# Patient Record
Sex: Female | Born: 1980 | Race: White | Hispanic: No | Marital: Single | Smoking: Former smoker
Health system: Southern US, Community
[De-identification: ages and names within clinical notes are randomized; demographics above are authoritative.]

## PROBLEM LIST (undated history)

## (undated) HISTORY — PX: CHOLECYSTECTOMY: SHX55

---
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
## 2004-04-03 ENCOUNTER — Emergency Department: Payer: Self-pay | Admitting: General Practice

## 2004-04-19 ENCOUNTER — Emergency Department: Payer: Self-pay | Admitting: Emergency Medicine

## 2005-04-19 ENCOUNTER — Emergency Department: Payer: Self-pay | Admitting: Emergency Medicine

## 2005-04-23 ENCOUNTER — Ambulatory Visit: Payer: Self-pay | Admitting: Emergency Medicine

## 2005-05-31 ENCOUNTER — Ambulatory Visit: Payer: Self-pay | Admitting: Family Medicine

## 2005-06-28 ENCOUNTER — Emergency Department: Payer: Self-pay | Admitting: Emergency Medicine

## 2005-09-29 ENCOUNTER — Observation Stay: Payer: Self-pay

## 2005-10-14 ENCOUNTER — Observation Stay: Payer: Self-pay | Admitting: Obstetrics & Gynecology

## 2005-11-06 ENCOUNTER — Observation Stay: Payer: Self-pay | Admitting: Obstetrics & Gynecology

## 2005-11-09 ENCOUNTER — Observation Stay: Payer: Self-pay

## 2005-11-24 ENCOUNTER — Observation Stay: Payer: Self-pay

## 2005-12-03 ENCOUNTER — Observation Stay: Payer: Self-pay | Admitting: Obstetrics and Gynecology

## 2005-12-07 ENCOUNTER — Observation Stay: Payer: Self-pay | Admitting: Obstetrics and Gynecology

## 2005-12-10 ENCOUNTER — Inpatient Hospital Stay: Payer: Self-pay | Admitting: Obstetrics & Gynecology

## 2006-04-07 ENCOUNTER — Other Ambulatory Visit: Payer: Self-pay

## 2006-04-07 ENCOUNTER — Inpatient Hospital Stay: Payer: Self-pay | Admitting: Vascular Surgery

## 2006-11-24 ENCOUNTER — Ambulatory Visit: Payer: Self-pay | Admitting: Gynecologic Oncology

## 2006-12-02 ENCOUNTER — Ambulatory Visit: Payer: Self-pay | Admitting: Gynecologic Oncology

## 2006-12-24 ENCOUNTER — Ambulatory Visit: Payer: Self-pay | Admitting: Gynecologic Oncology

## 2009-05-28 ENCOUNTER — Emergency Department: Payer: Self-pay | Admitting: Unknown Physician Specialty

## 2013-12-26 ENCOUNTER — Emergency Department: Payer: Self-pay | Admitting: Internal Medicine

## 2015-11-27 ENCOUNTER — Emergency Department: Payer: BLUE CROSS/BLUE SHIELD

## 2015-11-27 ENCOUNTER — Emergency Department
Admission: EM | Admit: 2015-11-27 | Discharge: 2015-11-27 | Disposition: A | Discharge status: Home / Self Care | Payer: BLUE CROSS/BLUE SHIELD | Attending: Emergency Medicine | Admitting: Emergency Medicine

## 2015-11-27 ENCOUNTER — Encounter: Payer: Self-pay | Admitting: Emergency Medicine

## 2015-11-27 DIAGNOSIS — R0789 Other chest pain: Secondary | ICD-10-CM | POA: Diagnosis not present

## 2015-11-27 DIAGNOSIS — F17200 Nicotine dependence, unspecified, uncomplicated: Secondary | ICD-10-CM | POA: Diagnosis not present

## 2015-11-27 DIAGNOSIS — R1013 Epigastric pain: Secondary | ICD-10-CM | POA: Insufficient documentation

## 2015-11-27 DIAGNOSIS — R079 Chest pain, unspecified: Secondary | ICD-10-CM

## 2015-11-27 LAB — CBC WITH DIFFERENTIAL/PLATELET
BASOS PCT: 1 %
Basophils Absolute: 0.2 10*3/uL — ABNORMAL HIGH (ref 0–0.1)
EOS ABS: 0.3 10*3/uL (ref 0–0.7)
Eosinophils Relative: 2 %
HEMATOCRIT: 42.2 % (ref 35.0–47.0)
HEMOGLOBIN: 14.3 g/dL (ref 12.0–16.0)
Lymphocytes Relative: 44 %
Lymphs Abs: 7.5 10*3/uL — ABNORMAL HIGH (ref 1.0–3.6)
MCH: 30.4 pg (ref 26.0–34.0)
MCHC: 33.8 g/dL (ref 32.0–36.0)
MCV: 90 fL (ref 80.0–100.0)
Monocytes Absolute: 0.7 10*3/uL (ref 0.2–0.9)
Monocytes Relative: 4 %
NEUTROS ABS: 8.4 10*3/uL — AB (ref 1.4–6.5)
NEUTROS PCT: 49 %
Platelets: 291 10*3/uL (ref 150–440)
RBC: 4.69 MIL/uL (ref 3.80–5.20)
RDW: 14.7 % — ABNORMAL HIGH (ref 11.5–14.5)
WBC: 17.1 10*3/uL — AB (ref 3.6–11.0)

## 2015-11-27 LAB — COMPREHENSIVE METABOLIC PANEL
ALBUMIN: 3.8 g/dL (ref 3.5–5.0)
ALK PHOS: 84 U/L (ref 38–126)
ALT: 42 U/L (ref 14–54)
ANION GAP: 7 (ref 5–15)
AST: 53 U/L — AB (ref 15–41)
CALCIUM: 9.6 mg/dL (ref 8.9–10.3)
CO2: 23 mmol/L (ref 22–32)
CREATININE: 0.47 mg/dL (ref 0.44–1.00)
Chloride: 108 mmol/L (ref 101–111)
GFR calc Af Amer: >60 mL/min (ref >60)
GFR calc non Af Amer: >60 mL/min (ref >60)
GLUCOSE: 139 mg/dL — AB (ref 65–99)
Potassium: 3.4 mmol/L — ABNORMAL LOW (ref 3.5–5.1)
SODIUM: 138 mmol/L (ref 135–145)
Total Bilirubin: 0.2 mg/dL — ABNORMAL LOW (ref 0.3–1.2)
Total Protein: 8.1 g/dL (ref 6.5–8.1)

## 2015-11-27 LAB — TROPONIN I: Troponin I: <0.03 ng/mL (ref <0.03)

## 2015-11-27 MED ORDER — RANITIDINE HCL 150 MG PO TABS: 150.0000 mg | ORAL_TABLET | Freq: Two times a day (BID) | ORAL | 1 refills | Status: DC

## 2015-11-27 MED ORDER — GI COCKTAIL ~~LOC~~
ORAL | Status: AC
  Administered 2015-11-27: 30 mL via ORAL
  Filled 2015-11-27: qty 30

## 2015-11-27 MED ORDER — GI COCKTAIL ~~LOC~~
30.0000 mL | Freq: Once | ORAL | Status: AC
  Administered 2015-11-27: 30 mL via ORAL

## 2015-11-27 MED ORDER — SUCRALFATE 1 G PO TABS: 1.0000 g | ORAL_TABLET | Freq: Four times a day (QID) | ORAL | 0 refills | Status: DC

## 2015-11-27 NOTE — ED Triage Notes (Signed)
C/o chest pain, worse with deep breathing.  Pt very anxious in triage.  Skin w/d with good color.

## 2015-11-27 NOTE — ED Provider Notes (Signed)
[facility] Emergency Department Provider Note    ____________________________________________   I have reviewed the triage vital signs and the nursing notes.   HISTORY  Chief Complaint Chest Pain   History limited by: Not Limited   HPI Emily Trevino is a 35 y.o. female  who presents to the emergency department today because of concerns for lower central chest pain. She states it is located in the lower central chest as well as in the right upper abdomen. It started this morning. It has been somewhat constant since it started. She describes it as being sharp. She did not have any appetite after it started. No associated shortness of breath or cough. No associated nausea. She has not noticed any change to her defecation recently.   History reviewed. No pertinent past medical history.  There are no active problems to display for this patient.   History reviewed. No pertinent surgical history.  Prior to Admission medications   Not on File    Allergies Review of patient's allergies indicates no known allergies.  No family history on file.  Social History Social History  Substance Use Topics  . Smoking status: Current Every Day Smoker  . Smokeless tobacco: Never Used  . Alcohol use Not on file    Review of Systems  Constitutional: Negative for fever. Cardiovascular: Positive for chest pain. Respiratory: Negative for shortness of breath. Gastrointestinal: Positive for abdominal pain. Neurological: Negative for headaches, focal weakness or numbness.  10-point ROS otherwise negative.  ____________________________________________   PHYSICAL EXAM:  VITAL SIGNS: ED Triage Vitals [11/27/15 1411]  Enc Vitals Group     BP 135/72     Pulse Rate 91     Resp (!) 22     Temp 98.3 F (36.8 C)     Temp Source Oral     SpO2 98 %     Weight 182 lb (82.6 kg)     Height 5\' 2"  (1.575 m)     Head Circumference      Peak Flow      Pain  Score 10   Constitutional: Alert and oriented. Well appearing and in no distress. Eyes: Conjunctivae are normal. Normal extraocular movements. ENT   Head: Normocephalic and atraumatic.   Nose: No congestion/rhinnorhea.   Mouth/Throat: Mucous membranes are moist.   Neck: No stridor. Hematological/Lymphatic/Immunilogical: No cervical lymphadenopathy. Cardiovascular: Normal rate, regular rhythm.  No murmurs, rubs, or gallops. Respiratory: Normal respiratory effort without tachypnea nor retractions. Breath sounds are clear and equal bilaterally. No wheezes/rales/rhonchi. Gastrointestinal: Soft and tender to palpation in the epigastric region. Genitourinary: Deferred Musculoskeletal: Normal range of motion in all extremities. No lower extremity edema. Neurologic:  Normal speech and language. No gross focal neurologic deficits are appreciated.  Skin:  Skin is warm, dry and intact. No rash noted. Psychiatric: Mood and affect are normal. Speech and behavior are normal. Patient exhibits appropriate insight and judgment.  ____________________________________________    LABS (pertinent positives/negatives)  Labs Reviewed  CBC WITH DIFFERENTIAL/PLATELET - Abnormal; Notable for the following:       Result Value   WBC 17.1 (*)    RDW 14.7 (*)    Neutro Abs 8.4 (*)    Lymphs Abs 7.5 (*)    Basophils Absolute 0.2 (*)    All other components within normal limits  COMPREHENSIVE METABOLIC PANEL - Abnormal; Notable for the following:    Potassium 3.4 (*)    Glucose, Bld 139 (*)    BUN <5 (*)  AST 53 (*)    Total Bilirubin 0.2 (*)    All other components within normal limits  TROPONIN I     ____________________________________________   EKG  I, Phineas SemenGraydon Sheddrick Lattanzio, attending physician, personally viewed and interpreted this EKG  EKG Time: 1407 Rate: 88 Rhythm: normal sinus rhythm Axis: left axis deviation Intervals: qtc 440 QRS: narrow ST changes: no st  elevation Impression: abnormal ekg   ____________________________________________    RADIOLOGY  CXR IMPRESSION:  Normal chest radiographs.      ____________________________________________   PROCEDURES  Procedures  ____________________________________________   INITIAL IMPRESSION / ASSESSMENT AND PLAN / ED COURSE  Pertinent labs & imaging results that were available during my care of the patient were reviewed by me and considered in my medical decision making (see chart for details).  Patient presented with chest pain today. Workup without any concerning findings a for mild leukocytosis of unclear significance. Patient did feel somewhat better after a GI cocktail. I did discuss importance of establishing care with primary care and I did request that she repeat blood work in a few weeks. Will discharge home with antacid and sucralfate.  ____________________________________________   FINAL CLINICAL IMPRESSION(S) / ED DIAGNOSES  Final diagnoses:  Chest pain, unspecified chest pain type     Note: This dictation was prepared with Dragon dictation. Any transcriptional errors that result from this process are unintentional    Phineas SemenGraydon Lillian Ballester, MD 11/27/15 1749

## 2015-11-27 NOTE — ED Notes (Signed)
Pt reports chest pain since awakening this am

## 2015-11-27 NOTE — Discharge Instructions (Signed)
Please seek medical attention for any high fevers, chest pain, shortness of breath, change in behavior, persistent vomiting, bloody stool or any other new or concerning symptoms.  

## 2019-03-11 ENCOUNTER — Other Ambulatory Visit: Payer: Self-pay | Admitting: Internal Medicine

## 2019-03-11 DIAGNOSIS — N6320 Unspecified lump in the left breast, unspecified quadrant: Secondary | ICD-10-CM

## 2019-03-12 ENCOUNTER — Other Ambulatory Visit: Payer: Self-pay | Admitting: Internal Medicine

## 2019-03-12 DIAGNOSIS — N6320 Unspecified lump in the left breast, unspecified quadrant: Secondary | ICD-10-CM

## 2019-03-24 ENCOUNTER — Ambulatory Visit
Admission: RE | Admit: 2019-03-24 | Discharge: 2019-03-24 | Disposition: A | Discharge status: Home / Self Care | Payer: BC Managed Care – PPO | Source: Ambulatory Visit | Attending: Internal Medicine | Admitting: Internal Medicine

## 2019-03-24 ENCOUNTER — Other Ambulatory Visit: Payer: Self-pay | Admitting: Internal Medicine

## 2019-03-24 ENCOUNTER — Other Ambulatory Visit: Payer: Self-pay

## 2019-03-24 DIAGNOSIS — N6320 Unspecified lump in the left breast, unspecified quadrant: Secondary | ICD-10-CM | POA: Insufficient documentation

## 2019-03-25 ENCOUNTER — Other Ambulatory Visit: Payer: Self-pay | Admitting: Internal Medicine

## 2019-04-05 ENCOUNTER — Other Ambulatory Visit: Payer: Self-pay | Admitting: Internal Medicine

## 2019-04-05 DIAGNOSIS — N6310 Unspecified lump in the right breast, unspecified quadrant: Secondary | ICD-10-CM

## 2019-07-11 ENCOUNTER — Encounter: Payer: Self-pay | Admitting: Internal Medicine

## 2019-07-28 ENCOUNTER — Emergency Department
Admission: EM | Admit: 2019-07-28 | Discharge: 2019-07-28 | Disposition: A | Discharge status: Home / Self Care | Payer: BC Managed Care – PPO | Attending: Emergency Medicine | Admitting: Emergency Medicine

## 2019-07-28 ENCOUNTER — Other Ambulatory Visit: Payer: Self-pay

## 2019-07-28 ENCOUNTER — Encounter: Payer: Self-pay | Admitting: Emergency Medicine

## 2019-07-28 ENCOUNTER — Emergency Department: Payer: BC Managed Care – PPO

## 2019-07-28 DIAGNOSIS — R109 Unspecified abdominal pain: Secondary | ICD-10-CM | POA: Diagnosis not present

## 2019-07-28 DIAGNOSIS — R1031 Right lower quadrant pain: Secondary | ICD-10-CM | POA: Diagnosis not present

## 2019-07-28 DIAGNOSIS — N390 Urinary tract infection, site not specified: Secondary | ICD-10-CM | POA: Diagnosis not present

## 2019-07-28 DIAGNOSIS — R319 Hematuria, unspecified: Secondary | ICD-10-CM | POA: Diagnosis not present

## 2019-07-28 DIAGNOSIS — F17200 Nicotine dependence, unspecified, uncomplicated: Secondary | ICD-10-CM | POA: Diagnosis not present

## 2019-07-28 DIAGNOSIS — Z9049 Acquired absence of other specified parts of digestive tract: Secondary | ICD-10-CM | POA: Diagnosis not present

## 2019-07-28 DIAGNOSIS — Z79899 Other long term (current) drug therapy: Secondary | ICD-10-CM | POA: Insufficient documentation

## 2019-07-28 LAB — URINALYSIS, COMPLETE (UACMP) WITH MICROSCOPIC
Bilirubin Urine: NEGATIVE
Glucose, UA: NEGATIVE mg/dL
Ketones, ur: NEGATIVE mg/dL
Nitrite: NEGATIVE
Protein, ur: NEGATIVE mg/dL
Specific Gravity, Urine: 1.004 — ABNORMAL LOW (ref 1.005–1.030)
pH: 8 (ref 5.0–8.0)

## 2019-07-28 LAB — CBC
HCT: 39.5 % (ref 36.0–46.0)
Hemoglobin: 13.2 g/dL (ref 12.0–15.0)
MCH: 29.6 pg (ref 26.0–34.0)
MCHC: 33.4 g/dL (ref 30.0–36.0)
MCV: 88.6 fL (ref 80.0–100.0)
Platelets: 322 10*3/uL (ref 150–400)
RBC: 4.46 MIL/uL (ref 3.87–5.11)
RDW: 13.4 % (ref 11.5–15.5)
WBC: 13.2 10*3/uL — ABNORMAL HIGH (ref 4.0–10.5)
nRBC: 0 % (ref 0.0–0.2)

## 2019-07-28 LAB — BASIC METABOLIC PANEL
Anion gap: 8 (ref 5–15)
BUN: 8 mg/dL (ref 6–20)
CO2: 21 mmol/L — ABNORMAL LOW (ref 22–32)
Calcium: 9 mg/dL (ref 8.9–10.3)
Chloride: 107 mmol/L (ref 98–111)
Creatinine, Ser: 0.58 mg/dL (ref 0.44–1.00)
GFR calc Af Amer: >60 mL/min (ref >60)
GFR calc non Af Amer: >60 mL/min (ref >60)
Glucose, Bld: 94 mg/dL (ref 70–99)
Potassium: 3.5 mmol/L (ref 3.5–5.1)
Sodium: 136 mmol/L (ref 135–145)

## 2019-07-28 LAB — POCT PREGNANCY, URINE: Preg Test, Ur: NEGATIVE

## 2019-07-28 MED ORDER — SODIUM CHLORIDE 0.9 % IV BOLUS
1000.0000 mL | Freq: Once | INTRAVENOUS | Status: AC
  Administered 2019-07-28: 1000 mL via INTRAVENOUS

## 2019-07-28 MED ORDER — ONDANSETRON HCL 4 MG/2ML IJ SOLN
4.0000 mg | Freq: Once | INTRAMUSCULAR | Status: AC
  Administered 2019-07-28: 4 mg via INTRAVENOUS
  Filled 2019-07-28: qty 2

## 2019-07-28 MED ORDER — KETOROLAC TROMETHAMINE 10 MG PO TABS: 10.0000 mg | ORAL_TABLET | Freq: Four times a day (QID) | ORAL | 0 refills | Status: DC | PRN

## 2019-07-28 MED ORDER — OXYCODONE-ACETAMINOPHEN 5-325 MG PO TABS
1.0000 | ORAL_TABLET | Freq: Once | ORAL | Status: AC
  Administered 2019-07-28: 1 via ORAL
  Filled 2019-07-28: qty 1

## 2019-07-28 MED ORDER — KETOROLAC TROMETHAMINE 30 MG/ML IJ SOLN
30.0000 mg | Freq: Once | INTRAMUSCULAR | Status: AC
  Administered 2019-07-28: 30 mg via INTRAVENOUS

## 2019-07-28 MED ORDER — KETOROLAC TROMETHAMINE 30 MG/ML IJ SOLN
INTRAMUSCULAR | Status: AC
  Filled 2019-07-28: qty 1

## 2019-07-28 MED ORDER — SODIUM CHLORIDE 0.9 % IV SOLN
1.0000 g | Freq: Once | INTRAVENOUS | Status: AC
  Administered 2019-07-28: 1 g via INTRAVENOUS
  Filled 2019-07-28: qty 10

## 2019-07-28 MED ORDER — OXYCODONE-ACETAMINOPHEN 5-325 MG PO TABS: 1.0000 | ORAL_TABLET | ORAL | 0 refills | Status: DC | PRN

## 2019-07-28 MED ORDER — CEFDINIR 300 MG PO CAPS: 300.0000 mg | ORAL_CAPSULE | Freq: Two times a day (BID) | ORAL | 0 refills | Status: DC

## 2019-07-28 MED ORDER — MORPHINE SULFATE (PF) 4 MG/ML IV SOLN
4.0000 mg | Freq: Once | INTRAVENOUS | Status: AC
  Administered 2019-07-28: 4 mg via INTRAVENOUS
  Filled 2019-07-28: qty 1

## 2019-07-28 NOTE — ED Triage Notes (Signed)
Pt presents to ED c/o R flank pain wrapping around to RLQ x1 wk.

## 2019-07-28 NOTE — ED Notes (Signed)
A rolled towel placed behind pt back and lights out for comfort. Pt resting quietly.

## 2019-07-28 NOTE — ED Provider Notes (Signed)
[facility] Emergency Department Provider Note  ____________________________________________   First MD Initiated Contact with Patient 07/28/19 1700     (approximate)  I have reviewed the triage vital signs and the nursing notes.   HISTORY  Chief Complaint Flank Pain    HPI Emily Trevino Emily Trevino is a 39 y.o. female [facility] emergency department complaining of right-sided flank pain that radiates to the lower abdomen.  Patient states symptoms have been ongoing since she started her period last week.  States the pain has gotten so bad she cannot hardly stand.  Decreased appetite.  No fever or chills.  No vomiting or diarrhea.    History reviewed. No pertinent past medical history.  There are no problems to display for this patient.   Past Surgical History:  Procedure Laterality Date  . CHOLECYSTECTOMY      Prior to Admission medications   Medication Sig Start Date End Date Taking? Authorizing Provider  cefdinir (OMNICEF) 300 MG capsule Take 1 capsule (300 mg total) by mouth 2 (two) times daily. 07/28/19   Heike Pounds, Linden Dolin, PA-C  escitalopram (LEXAPRO) 20 MG tablet Take 20 mg by mouth daily. 03/05/19   [provider]  ketorolac (TORADOL) 10 MG tablet Take 1 tablet (10 mg total) by mouth every 6 (six) hours as needed. 07/28/19   Kristine Chahal, Linden Dolin, PA-C  levothyroxine (SYNTHROID) 50 MCG tablet Take 50 mcg by mouth daily. 04/05/19   [provider]  oxyCODONE-acetaminophen (PERCOCET) 5-325 MG tablet Take 1 tablet by mouth every 4 (four) hours as needed for severe pain. 07/28/19 07/27/20  Caryn Section Linden Dolin, PA-C  ranitidine (ZANTAC) 150 MG tablet Take 1 tablet (150 mg total) by mouth 2 (two) times daily. 11/27/15 11/26/16  Nance Pear, MD  sucralfate (CARAFATE) 1 g tablet Take 1 tablet (1 g total) by mouth 4 (four) times daily. 11/27/15   Nance Pear, MD  traZODone (DESYREL) 50 MG tablet Take 50 mg by mouth at bedtime. 07/06/19   [provider]     Allergies Patient has no known allergies.  Family History  Adopted: Yes    Social History Social History   Tobacco Use  . Smoking status: Current Every Day Smoker  . Smokeless tobacco: Never Used  Substance Use Topics  . Alcohol use: Not on file  . Drug use: Not on file    Review of Systems  Constitutional: No fever/chills Eyes: No visual changes. ENT: No sore throat. Respiratory: Denies cough Cardiovascular: Denies chest pain Gastrointestinal: Positive abdominal pain Genitourinary: Negative for dysuria. Musculoskeletal: Negative for back pain. Skin: Negative for rash. Psychiatric: no mood changes,     ____________________________________________   PHYSICAL EXAM:  VITAL SIGNS: ED Triage Vitals  Enc Vitals Group     BP 07/28/19 1603 114/74     Pulse Rate 07/28/19 1603 89     Resp 07/28/19 1603 18     Temp 07/28/19 1603 98.4 F (36.9 C)     Temp Source 07/28/19 1603 Oral     SpO2 07/28/19 1603 99 %     Weight 07/28/19 1603 192 lb (87.1 kg)     Height 07/28/19 1603 5\' 2"  (1.575 m)     Head Circumference --      Peak Flow --      Pain Score 07/28/19 1608 9     Pain Loc --      Pain Edu? --      Excl. in [place]? --     Constitutional:  Alert and oriented. Well appearing and in no acute distress.  Patient is crying due to discomfort Eyes: Conjunctivae are normal.  Head: Atraumatic. Nose: No congestion/rhinnorhea. Mouth/Throat: Mucous membranes are moist.   Neck:  supple no lymphadenopathy noted Cardiovascular: Normal rate, regular rhythm. Heart sounds are normal Respiratory: Normal respiratory effort.  No retractions, lungs c t a  Abd: soft tender in the right upper and lower quadrants, bs normal all 4 quad GU: deferred Musculoskeletal: FROM all extremities, warm and well perfused Neurologic:  Normal speech and language.  Skin:  Skin is warm, dry and intact. No rash noted. Psychiatric: Mood and affect are normal. Speech and behavior are  normal.  ____________________________________________   LABS (all labs ordered are listed, but only abnormal results are displayed)  Labs Reviewed  URINALYSIS, COMPLETE (UACMP) WITH MICROSCOPIC - Abnormal; Notable for the following components:      Result Value   Color, Urine STRAW (*)    APPearance HAZY (*)    Specific Gravity, Urine 1.004 (*)    Hgb urine dipstick LARGE (*)    Leukocytes,Ua SMALL (*)    Bacteria, UA RARE (*)    All other components within normal limits  BASIC METABOLIC PANEL - Abnormal; Notable for the following components:   CO2 21 (*)    All other components within normal limits  CBC - Abnormal; Notable for the following components:   WBC 13.2 (*)    All other components within normal limits  POC URINE PREG, ED  POCT PREGNANCY, URINE   ____________________________________________   ____________________________________________  RADIOLOGY  CT renal stone study does not show a current stone, has some edema in the collecting area which could be infection versus recently passed stone.  ____________________________________________   PROCEDURES  Procedure(s) performed: Rocephin 1 g IV, morphine 4 mg IV, Zofran 4 mg IV, Toradol 30 mg IV, normal saline 1 L IV   Procedures    ____________________________________________   INITIAL IMPRESSION / ASSESSMENT AND PLAN / ED COURSE  Pertinent labs & imaging results that were available during my care of the patient were reviewed by me and considered in my medical decision making (see chart for details).   Patient is a 39 year old female presents emergency department with concerns of right-sided abdominal pain.  See HPI  Physical exam shows patient to appear in a good amount of discomfort.  Vitals are stable.  Abdomen is tender along the right upper and lower quadrants.  DDx: Kidney stone, acute appendicitis, ovarian cyst  CBC with elevated WBC of 13.2, basic metabolic panel is normal, POC pregnancy is  negative, urinalysis is hazy with large amount of hemoglobin, small leuks and rare bacteria  CT renal stone study did show some edema at the collecting area but no obvious stone.  Due to the elevated WBC along with the leuks in her urine I will treat her for infection.  She is to follow-up with her regular doctor if not improving in 2 to 3 days.  Return emergency department worsening.  She was given Rocephin 1 g IV, Toradol 30 mg IV, and morphine 4 mg IV.  She was given Percocet at discharge.  She was also given a prescription for Omnicef, Percocet, and Toradol.  She was given strict instructions once again to return if worsening.  She states she understands.  She is discharged stable condition.  Explained the lab results to the patient.  Explained her we do need to do a renal stone study due to the flank pain  and the hematuria.  This may also be able to rule out appendicitis.  She was given an IV, morphine 4 mg IV, Zofran 4 mg IV, and 1 L normal saline.   Emily Trevino was evaluated in Emergency Department on 07/28/2019 for the symptoms described in the history of present illness. She was evaluated in the context of the global COVID-19 pandemic, which necessitated consideration that the patient might be at risk for infection with the SARS-CoV-2 virus that causes COVID-19. Institutional protocols and algorithms that pertain to the evaluation of patients at risk for COVID-19 are in a state of rapid change based on information released by regulatory bodies including the CDC and federal and state organizations. These policies and algorithms were followed during the patient's care in the ED.   As part of my medical decision making, I reviewed the following data within the electronic MEDICAL RECORD NUMBER Nursing notes reviewed and incorporated, Labs reviewed , Old chart reviewed, Radiograph reviewed , Notes from prior ED visits and [place] Controlled Substance  Database  ____________________________________________   FINAL CLINICAL IMPRESSION(S) / ED DIAGNOSES  Final diagnoses:  Right flank pain  Urinary tract infection with hematuria, site unspecified      NEW MEDICATIONS STARTED DURING THIS VISIT:  New Prescriptions   CEFDINIR (OMNICEF) 300 MG CAPSULE    Take 1 capsule (300 mg total) by mouth 2 (two) times daily.   KETOROLAC (TORADOL) 10 MG TABLET    Take 1 tablet (10 mg total) by mouth every 6 (six) hours as needed.   OXYCODONE-ACETAMINOPHEN (PERCOCET) 5-325 MG TABLET    Take 1 tablet by mouth every 4 (four) hours as needed for severe pain.     Note:  This document was prepared using Dragon voice recognition software and may include unintentional dictation errors.    Faythe Ghee, PA-C 07/28/19 1820    Concha Se, MD 07/28/19 2023

## 2019-07-28 NOTE — Discharge Instructions (Signed)
Follow-up with your regular doctor if not improved in 3 days.  Return emergency department worsening.  CT did show swelling around the renal collecting area which could indicate infection or recently passed stone.  Being treated for an infection.  Take your antibiotic as prescribed.  Pain medication as needed.

## 2019-07-31 ENCOUNTER — Emergency Department: Payer: BC Managed Care – PPO

## 2019-07-31 ENCOUNTER — Other Ambulatory Visit: Payer: Self-pay

## 2019-07-31 DIAGNOSIS — R0602 Shortness of breath: Secondary | ICD-10-CM | POA: Insufficient documentation

## 2019-07-31 DIAGNOSIS — Z5321 Procedure and treatment not carried out due to patient leaving prior to being seen by health care provider: Secondary | ICD-10-CM | POA: Insufficient documentation

## 2019-07-31 LAB — CBC
HCT: 39.7 % (ref 36.0–46.0)
Hemoglobin: 13.4 g/dL (ref 12.0–15.0)
MCH: 29.5 pg (ref 26.0–34.0)
MCHC: 33.8 g/dL (ref 30.0–36.0)
MCV: 87.3 fL (ref 80.0–100.0)
Platelets: 298 10*3/uL (ref 150–400)
RBC: 4.55 MIL/uL (ref 3.87–5.11)
RDW: 13.2 % (ref 11.5–15.5)
WBC: 10.8 10*3/uL — ABNORMAL HIGH (ref 4.0–10.5)
nRBC: 0 % (ref 0.0–0.2)

## 2019-07-31 LAB — COMPREHENSIVE METABOLIC PANEL
ALT: 21 U/L (ref 0–44)
AST: 25 U/L (ref 15–41)
Albumin: 3.9 g/dL (ref 3.5–5.0)
Alkaline Phosphatase: 62 U/L (ref 38–126)
Anion gap: 10 (ref 5–15)
BUN: 8 mg/dL (ref 6–20)
CO2: 20 mmol/L — ABNORMAL LOW (ref 22–32)
Calcium: 9.1 mg/dL (ref 8.9–10.3)
Chloride: 107 mmol/L (ref 98–111)
Creatinine, Ser: 0.56 mg/dL (ref 0.44–1.00)
GFR calc Af Amer: >60 mL/min (ref >60)
GFR calc non Af Amer: >60 mL/min (ref >60)
Glucose, Bld: 192 mg/dL — ABNORMAL HIGH (ref 70–99)
Potassium: 3.7 mmol/L (ref 3.5–5.1)
Sodium: 137 mmol/L (ref 135–145)
Total Bilirubin: 0.4 mg/dL (ref 0.3–1.2)
Total Protein: 7.9 g/dL (ref 6.5–8.1)

## 2019-07-31 MED ORDER — OXYCODONE-ACETAMINOPHEN 5-325 MG PO TABS
1.0000 | ORAL_TABLET | ORAL | Status: DC | PRN
  Administered 2019-07-31: 1 via ORAL
  Filled 2019-07-31: qty 1

## 2019-07-31 NOTE — ED Notes (Signed)
Patient given update regarding wait time, verbalized understanding. 

## 2019-07-31 NOTE — ED Triage Notes (Signed)
Pt states was here this weeks and diagnosed with a kidney stone. Pt states she feel shob, has increased pain to back. Pt denies vomiting or known fever. Pt states has been taking her antibiotics.

## 2019-08-01 ENCOUNTER — Emergency Department
Admission: EM | Admit: 2019-08-01 | Discharge: 2019-08-01 | Disposition: A | Discharge status: Home / Self Care | Payer: BC Managed Care – PPO | Attending: Emergency Medicine | Admitting: Emergency Medicine

## 2019-08-01 NOTE — ED Notes (Signed)
Pt updated on wait. Pt verbalizes understanding, but states she may not be able to wait much longer due to having to work in am.

## 2019-08-01 NOTE — ED Notes (Signed)
Pt states she cannot wait any longer. Pt encouraged to stay and be seen, declines. Pt encouraged to return if problems occur, pt verbalizes understanding. Pt states her husband is waiting for her in truck.

## 2019-08-05 ENCOUNTER — Ambulatory Visit: Payer: BC Managed Care – PPO | Admitting: Internal Medicine

## 2019-08-06 ENCOUNTER — Encounter: Payer: Self-pay | Admitting: Internal Medicine

## 2019-08-06 ENCOUNTER — Other Ambulatory Visit: Payer: Self-pay

## 2019-08-06 ENCOUNTER — Ambulatory Visit (INDEPENDENT_AMBULATORY_CARE_PROVIDER_SITE_OTHER): Payer: BC Managed Care – PPO | Admitting: Internal Medicine

## 2019-08-06 VITALS — BP 129/82 | HR 91 | Wt 189.2 lb

## 2019-08-06 DIAGNOSIS — F17200 Nicotine dependence, unspecified, uncomplicated: Secondary | ICD-10-CM | POA: Diagnosis not present

## 2019-08-06 DIAGNOSIS — L03314 Cellulitis of groin: Secondary | ICD-10-CM

## 2019-08-06 DIAGNOSIS — N200 Calculus of kidney: Secondary | ICD-10-CM | POA: Diagnosis not present

## 2019-08-06 DIAGNOSIS — E669 Obesity, unspecified: Secondary | ICD-10-CM

## 2019-08-06 DIAGNOSIS — R7303 Prediabetes: Secondary | ICD-10-CM

## 2019-08-06 DIAGNOSIS — J301 Allergic rhinitis due to pollen: Secondary | ICD-10-CM | POA: Diagnosis not present

## 2019-08-06 NOTE — Assessment & Plan Note (Signed)
Started on Keflex

## 2019-08-06 NOTE — Assessment & Plan Note (Signed)
Controlled with diet 

## 2019-08-06 NOTE — Progress Notes (Addendum)
Established Patient Office Visit  Subjective:  Patient ID: Emily Trevino, female    DOB: December 03, 1980  Age: 39 y.o. MRN: [id number]  CC:  Chief Complaint  Patient presents with  . lab results    patient here for results of her thryoid panel     HPI  Emily Trevino presented for swelling of the left groin area patient has been developing some boil painful swelling of the left groin area with minimal discharge there is no history of any vaginal discharge lymphadenitis or injury to that area except for intermittent shaving the groin and legs she also stopped her thyroid medication she has been taking 50 mcg of levothyroxine a day has been started by a nurse practitioner but she complains of headache.  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 daily and after a month she can take it to 50 mcg p.o. daily.   If she did not tolerated none we will start her back on an different brand.  She has a history of kidney stone seen in the ER CT scan was done.   She is better and there is no hematuria no flank pain.   #3 he has a problem with obesity and she is trying to work on that.   She also complains of a small rash like pimples on the forehead I told her that we will try to put her on doxycycline and a small dose after she got better from the cellulitis of the left thigh she has a sleeping problem and take does not help for that  History reviewed. No pertinent past medical history.  Past Surgical History:  Procedure Laterality Date  . CHOLECYSTECTOMY      Family History  Adopted: Yes    Social History   Socioeconomic History  . Marital status: Single    Spouse name: Not on file  . Number of children: Not on file  . Years of education: Not on file  . Highest education level: Not on file  Occupational History  . Not on file  Tobacco Use  . Smoking status: Current Every Day  Smoker  . Smokeless tobacco: Never Used  Substance and Sexual Activity  . Alcohol use: Not on file  . Drug use: Not on file  . Sexual activity: Not on file  Other Topics Concern  . Not on file  Social History Narrative  . Not on file   Social Determinants of Health   Financial Resource Strain:   . Difficulty of Paying Living Expenses:   Food Insecurity:   . Worried About Programme researcher, broadcasting/film/video in the Last Year:   . Barista in the Last Year:   Transportation Needs:   . Freight forwarder (Medical):   Marland Kitchen Lack of Transportation (Non-Medical):   Physical Activity:   . Days of Exercise per Week:   . Minutes of Exercise per Session:   Stress:   . Feeling of Stress :   Social Connections:   . Frequency of Communication with Friends and Family:   . Frequency of Social Gatherings with Friends and Family:   . Attends Religious Services:   . Active Member of Clubs or Organizations:   . Attends Banker Meetings:   Marland Kitchen Marital Status:   Intimate Partner Violence:   . Fear of Current or Ex-Partner:   .  Emotionally Abused:   Marland Kitchen Physically Abused:   . Sexually Abused:      Current Outpatient Medications:  .  cefdinir (OMNICEF) 300 MG capsule, Take 1 capsule (300 mg total) by mouth 2 (two) times daily., Disp: 20 capsule, Rfl: 0 .  ketorolac (TORADOL) 10 MG tablet, Take 1 tablet (10 mg total) by mouth every 6 (six) hours as needed., Disp: 20 tablet, Rfl: 0 .  levothyroxine (SYNTHROID) 50 MCG tablet, Take 50 mcg by mouth daily., Disp: , Rfl:  .  traZODone (DESYREL) 50 MG tablet, Take 50 mg by mouth at bedtime., Disp: , Rfl:  .  ranitidine (ZANTAC) 150 MG tablet, Take 1 tablet (150 mg total) by mouth 2 (two) times daily., Disp: 60 tablet, Rfl: 1   No Known Allergies  ROS Review of Systems  Constitutional: Negative.  Negative for chills and fever.  HENT: Negative.  Negative for hearing loss, nosebleeds, postnasal drip, rhinorrhea, sinus pressure and sore throat.     Eyes: Negative.   Respiratory: Negative.   Cardiovascular: Negative.   Gastrointestinal: Negative.   Endocrine: Negative for polydipsia.  Genitourinary: Positive for flank pain.  Skin: Positive for color change.       She has swelling of the left groin with oozing discharge she is tender without any lymph node enlargement  Neurological: Negative for dizziness.  Hematological: Negative for adenopathy.      Objective:    Physical Exam  Constitutional: She appears well-developed and well-nourished.  HENT:  Head: Normocephalic and atraumatic.  Mouth/Throat: Oropharynx is clear and moist.  Eyes: Pupils are equal, round, and reactive to light. No scleral icterus.  Neck: No JVD present. No tracheal deviation present.  Cardiovascular: Normal rate, regular rhythm, normal heart sounds and intact distal pulses.  Pulmonary/Chest: Effort normal and breath sounds normal. She has no wheezes. She has no rales.  Abdominal: There is no abdominal tenderness. There is no rebound.  Musculoskeletal:        General: No edema.     Cervical back: Neck supple.  Lymphadenopathy:    She has no cervical adenopathy.  Neurological: No cranial nerve deficit.  Skin: Rash (forehead) noted.  Psychiatric: Her behavior is normal.    BP 129/82   Pulse 91   Wt 189 lb 3.2 oz (85.8 kg)   LMP 07/22/2019 (Exact Date)   BMI 34.61 kg/m  Wt Readings from Last 3 Encounters:  08/06/19 189 lb 3.2 oz (85.8 kg)  07/31/19 192 lb (87.1 kg)  07/28/19 192 lb (87.1 kg)     Health Maintenance Due  Topic Date Due  . HIV Screening  Never done  . COVID-19 Vaccine (1) Never done  . TETANUS/TDAP  Never done  . PAP SMEAR-Modifier  Never done    There are no preventive care reminders to display for this patient.  No results found for: TSH Lab Results  Component Value Date   WBC 10.8 (H) 07/31/2019   HGB 13.4 07/31/2019   HCT 39.7 07/31/2019   MCV 87.3 07/31/2019   PLT 298 07/31/2019   Lab Results  Component  Value Date   NA 137 07/31/2019   K 3.7 07/31/2019   CO2 20 (L) 07/31/2019   GLUCOSE 192 (H) 07/31/2019   BUN 8 07/31/2019   CREATININE 0.56 07/31/2019   BILITOT 0.4 07/31/2019   ALKPHOS 62 07/31/2019   AST 25 07/31/2019   ALT 21 07/31/2019   PROT 7.9 07/31/2019   ALBUMIN 3.9 07/31/2019   CALCIUM 9.1 07/31/2019  ANIONGAP 10 07/31/2019   No results found for: CHOL No results found for: HDL No results found for: LDLCALC No results found for: TRIG No results found for: CHOLHDL No results found for: FUXN2T    Assessment & Plan:   Problem List Items Addressed This Visit      Respiratory   Seasonal allergic rhinitis due to pollen     Genitourinary   Bilateral nephrolithiasis    Stable   Ref to urologist  For follow up        Other   Cellulitis of groin, left - Primary    Started on Keflex      Obesity (BMI 35.0-39.9 without comorbidity)   prediabetes.    smoking cessation Advised to stop smoking and work on losing weight  No orders of the defined types were placed in this encounter.  WBC count is elevated because of the infection 98f left groin Patient blood sugar is 192 and she was advised to follow low sugar diet we may have to get hemoglobin AIC done in future.  She was also advised to start back on half the dose of levothyroxine.  Her recent headache could be due to allergy season follow-up:  Return in about 2 weeks (around 08/20/2019).    Corky Downs, MD

## 2019-08-06 NOTE — Assessment & Plan Note (Signed)
stable °

## 2019-08-10 DIAGNOSIS — F17200 Nicotine dependence, unspecified, uncomplicated: Secondary | ICD-10-CM | POA: Insufficient documentation

## 2019-08-24 ENCOUNTER — Other Ambulatory Visit: Payer: Self-pay

## 2019-08-24 ENCOUNTER — Ambulatory Visit (INDEPENDENT_AMBULATORY_CARE_PROVIDER_SITE_OTHER): Payer: BC Managed Care – PPO | Admitting: Internal Medicine

## 2019-08-24 ENCOUNTER — Encounter: Payer: Self-pay | Admitting: Internal Medicine

## 2019-08-24 VITALS — BP 120/75 | HR 68 | Wt 191.4 lb

## 2019-08-24 DIAGNOSIS — L03314 Cellulitis of groin: Secondary | ICD-10-CM | POA: Diagnosis not present

## 2019-08-24 DIAGNOSIS — R7303 Prediabetes: Secondary | ICD-10-CM | POA: Diagnosis not present

## 2019-08-24 DIAGNOSIS — E669 Obesity, unspecified: Secondary | ICD-10-CM

## 2019-08-24 DIAGNOSIS — F17200 Nicotine dependence, unspecified, uncomplicated: Secondary | ICD-10-CM

## 2019-08-24 LAB — GLUCOSE, POCT (MANUAL RESULT ENTRY): POC Glucose: 97 mg/dl (ref 70–99)

## 2019-08-24 NOTE — Assessment & Plan Note (Signed)
Patient advised to quit smoking. 

## 2019-08-24 NOTE — Progress Notes (Signed)
Established Patient Office Visit  Subjective:  Patient ID: Emily Trevino, female    DOB: [birth year]  Age: 39 y.o. MRN: [id number]  CC:  Chief Complaint  Patient presents with  . Follow-up    2 week follow up for abcess on left leg    HPI  Emily Trevino Revis [name] presents for follow-up on the left groin pain.  She had a cellulitis of the groin area was treated with antibiotic now it has resolved and she is feeling much better.  She complains of some swelling of the left axilla is an old lymph node which is there for some time and has not changed.  She also complains of itching on the right eye.  History reviewed. No pertinent past medical history.  Past Surgical History:  Procedure Laterality Date  . CHOLECYSTECTOMY      Family History  Adopted: Yes    Social History   Socioeconomic History  . Marital status: Single    Spouse name: Not on file  . Number of children: Not on file  . Years of education: Not on file  . Highest education level: Not on file  Occupational History  . Not on file  Tobacco Use  . Smoking status: Current Every Day Smoker  . Smokeless tobacco: Never Used  Substance and Sexual Activity  . Alcohol use: Not on file  . Drug use: Not on file  . Sexual activity: Not on file  Other Topics Concern  . Not on file  Social History Narrative  . Not on file   Social Determinants of Health   Financial Resource Strain:   . Difficulty of Paying Living Expenses:   Food Insecurity:   . Worried About Programme researcher, broadcasting/film/video in the Last Year:   . Barista in the Last Year:   Transportation Needs:   . Freight forwarder (Medical):   Marland Kitchen Lack of Transportation (Non-Medical):   Physical Activity:   . Days of Exercise per Week:   . Minutes of Exercise per Session:   Stress:   . Feeling of Stress :   Social Connections:   . Frequency of Communication with Friends and Family:   . Frequency of Social Gatherings with Friends and Family:   .  Attends Religious Services:   . Active Member of Clubs or Organizations:   . Attends Banker Meetings:   Marland Kitchen Marital Status:   Intimate Partner Violence:   . Fear of Current or Ex-Partner:   . Emotionally Abused:   Marland Kitchen Physically Abused:   . Sexually Abused:      Current Outpatient Medications:  .  levothyroxine (SYNTHROID) 50 MCG tablet, Take 50 mcg by mouth daily., Disp: , Rfl:  .  traZODone (DESYREL) 50 MG tablet, Take 50 mg by mouth at bedtime., Disp: , Rfl:    No Known Allergies  ROS Review of Systems  Constitutional: Negative.   HENT: Negative.   Eyes: Positive for itching.  Respiratory: Negative.   Cardiovascular: Negative.   Gastrointestinal: Negative.   Neurological: Negative.   Hematological: Negative.   Psychiatric/Behavioral: Negative.       Objective:    Physical Exam  Constitutional: She appears well-developed and well-nourished.  HENT:  Head: Normocephalic.  Eyes: Pupils are equal, round, and reactive to light.  Neck: No JVD present. No tracheal deviation present. No thyromegaly present.  Cardiovascular: Regular rhythm.  No murmur heard. Pulmonary/Chest: She is in respiratory distress.  Abdominal: There is  no abdominal tenderness.  Musculoskeletal:        General: Normal range of motion.     Cervical back: Normal range of motion and neck supple.  Lymphadenopathy:    She has no cervical adenopathy.  Neurological: She is alert.  Skin: Skin is warm.    BP 120/75   Pulse 68   Wt 191 lb 6.4 oz (86.8 kg)   BMI 35.01 kg/m  Wt Readings from Last 3 Encounters:  08/24/19 191 lb 6.4 oz (86.8 kg)  08/06/19 189 lb 3.2 oz (85.8 kg)  07/31/19 192 lb (87.1 kg)     Health Maintenance Due  Topic Date Due  . COVID-19 Vaccine (1) Never done  . HIV Screening  Never done  . TETANUS/TDAP  Never done  . PAP SMEAR-Modifier  Never done    There are no preventive care reminders to display for this patient.  No results found for: TSH Lab Results   Component Value Date   WBC 10.8 (H) 07/31/2019   HGB 13.4 07/31/2019   HCT 39.7 07/31/2019   MCV 87.3 07/31/2019   PLT 298 07/31/2019   Lab Results  Component Value Date   NA 137 07/31/2019   K 3.7 07/31/2019   CO2 20 (L) 07/31/2019   GLUCOSE 192 (H) 07/31/2019   BUN 8 07/31/2019   CREATININE 0.56 07/31/2019   BILITOT 0.4 07/31/2019   ALKPHOS 62 07/31/2019   AST 25 07/31/2019   ALT 21 07/31/2019   PROT 7.9 07/31/2019   ALBUMIN 3.9 07/31/2019   CALCIUM 9.1 07/31/2019   ANIONGAP 10 07/31/2019   No results found for: CHOL No results found for: HDL No results found for: LDLCALC No results found for: TRIG No results found for: CHOLHDL No results found for: HGBA1C    Assessment & Plan:   Problem List Items Addressed This Visit      Other   Cellulitis of groin, left - Primary    Left groin is much better the swelling and inflammation is gone.  Right groin also looks good.      Obesity (BMI 35.0-39.9 without comorbidity)    Patient was advised to go on a diet to lose weight.      Prediabetes    Diet explained.      Needs smoking cessation education    Patient advised to quit smoking.         No orders of the defined types were placed in this encounter.  1. Cellulitis of groin, left Resolved.  2. Obesity (BMI 35.0-39.9 without comorbidity) Educated to lose weight.  3. Needs smoking cessation education Need to continue to refrain from smoking.  4. Prediabetes Discussed the management. Follow-up: Return in about 4 weeks (around 09/21/2019).    Cletis Athens, MD

## 2019-08-24 NOTE — Assessment & Plan Note (Signed)
Patient was advised to go on a diet to lose weight.

## 2019-08-24 NOTE — Assessment & Plan Note (Signed)
Left groin is much better the swelling and inflammation is gone.  Right groin also looks good.

## 2019-08-24 NOTE — Assessment & Plan Note (Signed)
Diet explained 

## 2019-11-23 ENCOUNTER — Other Ambulatory Visit: Payer: Self-pay

## 2019-11-23 ENCOUNTER — Encounter: Payer: Self-pay | Admitting: Internal Medicine

## 2019-11-23 ENCOUNTER — Ambulatory Visit (INDEPENDENT_AMBULATORY_CARE_PROVIDER_SITE_OTHER): Payer: BC Managed Care – PPO | Admitting: Internal Medicine

## 2019-11-23 VITALS — BP 117/77 | HR 65 | Ht 61.0 in | Wt 190.0 lb

## 2019-11-23 DIAGNOSIS — F17200 Nicotine dependence, unspecified, uncomplicated: Secondary | ICD-10-CM

## 2019-11-23 DIAGNOSIS — N200 Calculus of kidney: Secondary | ICD-10-CM | POA: Diagnosis not present

## 2019-11-23 DIAGNOSIS — R7303 Prediabetes: Secondary | ICD-10-CM | POA: Diagnosis not present

## 2019-11-23 DIAGNOSIS — E669 Obesity, unspecified: Secondary | ICD-10-CM | POA: Diagnosis not present

## 2019-11-23 LAB — GLUCOSE, POCT (MANUAL RESULT ENTRY): POC Glucose: 109 mg/dl — AB (ref 70–99)

## 2019-11-23 NOTE — Assessment & Plan Note (Signed)
Pt does not have any kidney pain from the last 6 months. She was encouraged to lose weight, walk daily, and drink a lot of water.

## 2019-11-23 NOTE — Assessment & Plan Note (Signed)
Pt is vaping but once or twice a day. Advised to quit.

## 2019-11-23 NOTE — Assessment & Plan Note (Signed)
-   I encouraged the patient to lose weight.  - I educated them on making healthy dietary choices including eating more fruits and vegetables and less fried foods. - I encouraged the patient to exercise more, and educated on the benefits of exercise including weight loss, diabetes prevention, and hypertension prevention.   

## 2019-11-23 NOTE — Patient Instructions (Signed)
Understanding How COVID-19 Vaccines Work Updated Aug 19, 2019  What You Need to Know . COVID-19 vaccines are safe and effective. . You may have side effects after vaccination, but these are normal. . It typically takes two weeks after you are fully vaccinated for the body to build protection (immunity) against the virus that causes COVID-19. . If you are not vaccinated, find a vaccine. Keep taking all precautions until you are fully vaccinated. . If you are fully vaccinated, you can resume activities that you did prior to the pandemic. Learn more about what you can do when you have been fully vaccinated.  Vaccine sticker and vial The Immune System--the Body's Defense Against Infection To understand how COVID-19 vaccines work, it helps to first look at how our bodies fight illness. When germs, such as the virus that causes COVID-19, invade our bodies, they attack and multiply. This invasion, called an infection, is what causes illness. Our immune system uses several tools to fight infection. Blood contains red cells, which carry oxygen to tissues and organs, and white or immune cells, which fight infection. Different types of white blood cells fight infection in different ways:  Marland Kitchen Macrophages are white blood cells that swallow up and digest germs and dead or dying cells. The macrophages leave behind parts of the invading germs, called "antigens". The body identifies antigens as dangerous and stimulates antibodies to attack them. . B-lymphocytes are defensive white blood cells. They produce antibodies that attack the pieces of the virus left behind by the macrophages. . T-lymphocytes are another type of defensive white blood cell. They attack cells in the body that have already been infected. . The first time a person is infected with the virus that causes COVID-19, it can take several days or weeks for their body to make and use all the germ-fighting tools needed to get over the infection. After the  infection, the person's immune system remembers what it learned about how to protect the body against that disease.  The body keeps a few T-lymphocytes, called "memory cells," that go into action quickly if the body encounters the same virus again. When the familiar antigens are detected, B-lymphocytes produce antibodies to attack them. Experts are still learning how long these memory cells protect a person against the virus that causes COVID-19.  How COVID-19 Vaccines Work COVID-19 vaccines help our bodies develop immunity to the virus that causes COVID-19 without [place] having to get the illness.  COVID vaccine Different types of vaccines work in different ways to offer protection. But with all types of vaccines, the body is left with a supply of "memory" T-lymphocytes as well as B-lymphocytes that will remember how to fight that virus in the future.  It typically takes a few weeks after vaccination for the body to produce T-lymphocytes and B-lymphocytes. Therefore, it is possible that a person could be infected with the virus that causes COVID-19 just before or just after vaccination and then get sick because the vaccine did not have enough time to provide protection.  Sometimes after vaccination, the process of building immunity can cause symptoms, such as fever. These symptoms are normal and are signs that the body is building immunity.  Learn more about getting your vaccine.  Types of Vaccines Currently, there are three main types of COVID-19 vaccines that are authorized and recommended or undergoing large-scale (Phase 3) clinical trials in the [place].  Below is a description of how each type of vaccine prompts our bodies to recognize and  protect [place] from the virus that causes COVID-19. None of these vaccines can give you COVID-19.  Marland Kitchen mRNA vaccines contain material from the virus that causes COVID-19 that gives our cells instructions for how to make a harmless protein that is unique to  the virus. After our cells make copies of the protein, they destroy the genetic material from the vaccine. Our bodies recognize that the protein should not be there and build T-lymphocytes and B-lymphocytes that will remember how to fight the virus that causes COVID-19 if we are infected in the future. . Protein subunit vaccines include harmless pieces (proteins) of the virus that causes COVID-19 instead of the entire germ. Once vaccinated, our bodies recognize that the protein should not be there and build T-lymphocytes and antibodies that will remember how to fight the virus that causes COVID-19 if we are infected in the future. . Vector vaccines contain a modified version of a different virus than the one that causes COVID-19. Inside the shell of the modified virus, there is material from the virus that causes COVID-19. This is called a "viral vector." Once the viral vector is inside our cells, the genetic material gives cells instructions to make a protein that is unique to the virus that causes COVID-19. Using these instructions, our cells make copies of the protein. This prompts our bodies to build T-lymphocytes and B-lymphocytes that will remember how to fight that virus if we are infected in the future.  Some COVID-19 Vaccines Require More Than One Shot To be fully vaccinated, you will need two shots of some COVID-19 vaccines.  . Two shots: If you get a COVID-19 vaccine that requires two shots, you are considered fully vaccinated two weeks after your second shot. Pfizer-BioNTech and Moderna COVID-19 vaccines require two shots. . One Shot: If you get a COVID-19 vaccine that requires one shot, you are considered fully vaccinated two weeks after your shot. Johnson [employer] Johnson's [place] [employer] COVID-19 vaccine only requires one shot. . If it has been less than two weeks since your shot, or if you still need to get your second shot, you are NOT fully protected. Keep taking steps to protect yourself and others  until you are fully vaccinated (two weeks after your final shot).   Information from: [employer]

## 2019-11-23 NOTE — Assessment & Plan Note (Signed)
Blood sugar was 109 when checked today fasting. Pt was told she was prediabetic and she needed to lose weight and take on a diabetic diet. She was advised to buy a book on diabetes.

## 2019-11-23 NOTE — Progress Notes (Signed)
Established Patient Office Visit  SUBJECTIVE:  Subjective  Patient ID: Emily Trevino, female    DOB: Aug 10, 1980  Age: 39 y.o. MRN: [id number]  CC:  Chief Complaint  Patient presents with  . Follow-up    no complaints     HPI Emily Trevino is a 39 y.o. female presenting today for a routine follow up.   She has no general complaints today. The cellulitis of her left groin has resolved.   She stopped smoking cigarettes, but she did start vaping. She said she will vape once or twice per day. She is not vaccinated against COVID19; she does not intend to get the vaccine at this time as she is very hesitant about it.   No past medical history on file.  Past Surgical History:  Procedure Laterality Date  . CHOLECYSTECTOMY      Family History  Adopted: Yes    Social History   Socioeconomic History  . Marital status: Single    Spouse name: Not on file  . Number of children: Not on file  . Years of education: Not on file  . Highest education level: Not on file  Occupational History  . Not on file  Tobacco Use  . Smoking status: Former Games developer  . Smokeless tobacco: Never Used  Vaping Use  . Vaping Use: Every day  Substance and Sexual Activity  . Alcohol use: Not on file  . Drug use: Not on file  . Sexual activity: Not on file  Other Topics Concern  . Not on file  Social History Narrative  . Not on file   Social Determinants of Health   Financial Resource Strain:   . Difficulty of Paying Living Expenses: Not on file  Food Insecurity:   . Worried About Programme researcher, broadcasting/film/video in the Last Year: Not on file  . Ran Out of Food in the Last Year: Not on file  Transportation Needs:   . Lack of Transportation (Medical): Not on file  . Lack of Transportation (Non-Medical): Not on file  Physical Activity:   . Days of Exercise per Week: Not on file  . Minutes of Exercise per Session: Not on file  Stress:   . Feeling of Stress : Not on file  Social Connections:    . Frequency of Communication with Friends and Family: Not on file  . Frequency of Social Gatherings with Friends and Family: Not on file  . Attends Religious Services: Not on file  . Active Member of Clubs or Organizations: Not on file  . Attends Banker Meetings: Not on file  . Marital Status: Not on file  Intimate Partner Violence:   . Fear of Current or Ex-Partner: Not on file  . Emotionally Abused: Not on file  . Physically Abused: Not on file  . Sexually Abused: Not on file     Current Outpatient Medications:  .  levothyroxine (SYNTHROID) 50 MCG tablet, Take 50 mcg by mouth daily., Disp: , Rfl:  .  traZODone (DESYREL) 50 MG tablet, Take 50 mg by mouth at bedtime., Disp: , Rfl:    No Known Allergies  ROS Review of Systems  Constitutional: Negative.   HENT: Negative.   Eyes: Negative.   Respiratory: Negative.   Cardiovascular: Negative.   Gastrointestinal: Negative.   Endocrine: Negative.   Genitourinary: Negative.   Musculoskeletal: Negative.   Skin: Negative.   Allergic/Immunologic: Negative.   Neurological: Negative.   Hematological: Negative.   Psychiatric/Behavioral: Negative.  All other systems reviewed and are negative.    OBJECTIVE:    Physical Exam Vitals reviewed.  Constitutional:      Appearance: Normal appearance.  HENT:     Mouth/Throat:     Mouth: Mucous membranes are moist.  Eyes:     Pupils: Pupils are equal, round, and reactive to light.  Neck:     Vascular: No carotid bruit.  Cardiovascular:     Rate and Rhythm: Normal rate and regular rhythm.     Pulses: Normal pulses.     Heart sounds: Normal heart sounds.  Pulmonary:     Effort: Pulmonary effort is normal.     Breath sounds: Normal breath sounds.  Abdominal:     General: Bowel sounds are normal.     Palpations: Abdomen is soft. There is no hepatomegaly, splenomegaly or mass.     Tenderness: There is no abdominal tenderness.     Hernia: No hernia is present.    Musculoskeletal:        General: No tenderness.     Cervical back: Neck supple.     Right lower leg: No edema.     Left lower leg: No edema.  Skin:    Findings: No rash.  Neurological:     Mental Status: She is alert and oriented to person, place, and time.     Motor: No weakness.  Psychiatric:        Mood and Affect: Mood and affect normal.        Behavior: Behavior normal.     BP 117/77   Pulse 65   Ht 5\' 1"  (1.549 m)   Wt 190 lb (86.2 kg)   BMI 35.90 kg/m  Wt Readings from Last 3 Encounters:  11/23/19 190 lb (86.2 kg)  08/24/19 191 lb 6.4 oz (86.8 kg)  08/06/19 189 lb 3.2 oz (85.8 kg)    Health Maintenance Due  Topic Date Due  . Hepatitis C Screening  Never done  . COVID-19 Vaccine (1) Never done  . HIV Screening  Never done  . TETANUS/TDAP  Never done  . PAP SMEAR-Modifier  Never done  . INFLUENZA VACCINE  10/24/2019    There are no preventive care reminders to display for this patient.  CBC Latest Ref Rng & Units 07/31/2019 07/28/2019 11/27/2015  WBC 4.0 - 10.5 K/uL 10.8(H) 13.2(H) 17.1(H)  Hemoglobin 12.0 - 15.0 g/dL 01/27/2016 86.7 61.9  Hematocrit 36 - 46 % 39.7 39.5 42.2  Platelets 150 - 400 K/uL 298 322 291   CMP Latest Ref Rng & Units 07/31/2019 07/28/2019 11/27/2015  Glucose 70 - 99 mg/dL 01/27/2016) 94 326(Z)  BUN 6 - 20 mg/dL 8 8 124(P)  Creatinine <8(K - 1.00 mg/dL 9.98 3.38 2.50  Sodium 135 - 145 mmol/L 137 136 138  Potassium 3.5 - 5.1 mmol/L 3.7 3.5 3.4(L)  Chloride 98 - 111 mmol/L 107 107 108  CO2 22 - 32 mmol/L 20(L) 21(L) 23  Calcium 8.9 - 10.3 mg/dL 9.1 9.0 9.6  Total Protein 6.5 - 8.1 g/dL 7.9 - 8.1  Total Bilirubin 0.3 - 1.2 mg/dL 0.4 - 5.39)  Alkaline Phos 38 - 126 U/L 62 - 84  AST 15 - 41 U/L 25 - 53(H)  ALT 0 - 44 U/L 21 - 42    No results found for: TSH Lab Results  Component Value Date   ALBUMIN 3.9 07/31/2019   ANIONGAP 10 07/31/2019   No results found for: CHOL, HDL, LDLCALC, CHOLHDL No results found  for: TRIG No results found for:  HGBA1C    ASSESSMENT & PLAN:   Problem List Items Addressed This Visit      Genitourinary   Bilateral nephrolithiasis    Pt does not have any kidney pain from the last 6 months. She was encouraged to lose weight, walk daily, and drink a lot of water.         Other   Obesity (BMI 35.0-39.9 without comorbidity) - Primary    - I encouraged the patient to lose weight.  - I educated them on making healthy dietary choices including eating more fruits and vegetables and less fried foods. - I encouraged the patient to exercise more, and educated on the benefits of exercise including weight loss, diabetes prevention, and hypertension prevention.        Prediabetes    Blood sugar was 109 when checked today fasting. Pt was told she was prediabetic and she needed to lose weight and take on a diabetic diet. She was advised to buy a book on diabetes.       Relevant Orders   POCT glucose (manual entry) (Completed)   Needs smoking cessation education    Pt is vaping but once or twice a day. Advised to quit.          No orders of the defined types were placed in this encounter.   Follow-up: No follow-ups on file.    Dr. Woodroe Chen [facility] [address] [place], [place] [address]   By signing my name below, I, [employer], attest that this documentation has been prepared under the direction and in the presence of Corky Downs, MD. Electronically Signed: Corky Downs, MD 11/23/19, 10:23 AM   I personally performed the services described in this documentation, which was SCRIBED in my presence. The recorded information has been reviewed and considered accurate. It has been edited as necessary during review. Corky Downs, MD

## 2019-11-24 ENCOUNTER — Ambulatory Visit: Payer: BC Managed Care – PPO | Admitting: Internal Medicine

## 2019-11-24 DIAGNOSIS — Z713 Dietary counseling and surveillance: Secondary | ICD-10-CM | POA: Diagnosis not present

## 2019-11-24 DIAGNOSIS — E785 Hyperlipidemia, unspecified: Secondary | ICD-10-CM | POA: Diagnosis not present

## 2019-11-24 DIAGNOSIS — Z043 Encounter for examination and observation following other accident: Secondary | ICD-10-CM | POA: Diagnosis not present

## 2019-12-09 ENCOUNTER — Other Ambulatory Visit: Payer: Self-pay | Admitting: Internal Medicine

## 2019-12-10 DIAGNOSIS — U071 COVID-19: Secondary | ICD-10-CM | POA: Diagnosis not present

## 2019-12-10 DIAGNOSIS — R1084 Generalized abdominal pain: Secondary | ICD-10-CM | POA: Diagnosis not present

## 2019-12-10 DIAGNOSIS — R1012 Left upper quadrant pain: Secondary | ICD-10-CM | POA: Diagnosis not present

## 2019-12-10 DIAGNOSIS — R1011 Right upper quadrant pain: Secondary | ICD-10-CM | POA: Diagnosis not present

## 2019-12-10 DIAGNOSIS — R1031 Right lower quadrant pain: Secondary | ICD-10-CM | POA: Diagnosis not present

## 2019-12-11 DIAGNOSIS — R1031 Right lower quadrant pain: Secondary | ICD-10-CM | POA: Diagnosis not present

## 2019-12-14 ENCOUNTER — Other Ambulatory Visit: Payer: Self-pay | Admitting: Oncology

## 2019-12-14 ENCOUNTER — Telehealth: Payer: Self-pay | Admitting: Oncology

## 2019-12-14 DIAGNOSIS — U071 COVID-19: Secondary | ICD-10-CM

## 2019-12-14 NOTE — Progress Notes (Signed)
I connected by phone with  Emily Trevino to discuss the potential use of an new treatment for mild to moderate COVID-19 viral infection in non-hospitalized patients.   This patient is a age/sex that meets the FDA criteria for Emergency Use Authorization of casirivimab\imdevimab.  Has a (+) direct SARS-CoV-2 viral test result 1. Has mild or moderate COVID-19  2. Is ? 38 years of age and weighs ? 40 kg 3. Is NOT hospitalized due to COVID-19 4. Is NOT requiring oxygen therapy or requiring an increase in baseline oxygen flow rate due to COVID-19 5. Is within 10 days of symptom onset 6. Has at least one of the high risk factor(s) for progression to severe COVID-19 and/or hospitalization as defined in EUA. ? Specific high risk criteria :No past medical history on file. ? HIgh Risk Obesity    Symptom onset  12/11/2019   I have spoken and communicated the following to the patient or parent/caregiver:   1. FDA has authorized the emergency use of casirivimab\imdevimab for the treatment of mild to moderate COVID-19 in adults and pediatric patients with positive results of direct SARS-CoV-2 viral testing who are 6 years of age and older weighing at least 40 kg, and who are at high risk for progressing to severe COVID-19 and/or hospitalization.   2. The significant known and potential risks and benefits of casirivimab\imdevimab, and the extent to which such potential risks and benefits are unknown.   3. Information on available alternative treatments and the risks and benefits of those alternatives, including clinical trials.   4. Patients treated with casirivimab\imdevimab should continue to self-isolate and use infection control measures (e.g., wear mask, isolate, social distance, avoid sharing personal items, clean and disinfect "high touch" surfaces, and frequent handwashing) according to CDC guidelines.    5. The patient or parent/caregiver has the option to accept or refuse casirivimab\imdevimab .    After reviewing this information with the patient, The patient agreed to proceed with receiving casirivimab\imdevimab infusion and will be provided a copy of the Fact sheet prior to receiving the infusion.Mignon Pine, AGNP-C [phone] (Infusion Center Hotline)

## 2019-12-14 NOTE — Telephone Encounter (Signed)
Called to Discuss with patient about Covid symptoms and the use of regeneron, a monoclonal antibody infusion for those with mild to moderate Covid symptoms and at a high risk of hospitalization.     Pt is qualified for this infusion at the [facility] infusion center due to co-morbid conditions and/or a member of an at-risk group.    High Risk- Asthma   Unable to reach pt. Left message to return call.   Mignon Pine. AGNP [phone] (Infusion Center Hotline)

## 2019-12-15 ENCOUNTER — Ambulatory Visit (HOSPITAL_COMMUNITY)
Admission: RE | Admit: 2019-12-15 | Discharge: 2019-12-15 | Disposition: A | Discharge status: Home / Self Care | Payer: BC Managed Care – PPO | Source: Ambulatory Visit | Attending: Pulmonary Disease | Admitting: Pulmonary Disease

## 2019-12-15 ENCOUNTER — Other Ambulatory Visit (HOSPITAL_COMMUNITY): Payer: Self-pay

## 2019-12-15 DIAGNOSIS — U071 COVID-19: Secondary | ICD-10-CM | POA: Diagnosis not present

## 2019-12-15 MED ORDER — ALBUTEROL SULFATE HFA 108 (90 BASE) MCG/ACT IN AERS: 2.0000 | INHALATION_SPRAY | Freq: Once | RESPIRATORY_TRACT | Status: DC | PRN

## 2019-12-15 MED ORDER — FAMOTIDINE IN NACL 20-0.9 MG/50ML-% IV SOLN: 20.0000 mg | Freq: Once | INTRAVENOUS | Status: DC | PRN

## 2019-12-15 MED ORDER — SODIUM CHLORIDE 0.9 % IV SOLN
1200.0000 mg | Freq: Once | INTRAVENOUS | Status: AC
  Administered 2019-12-15: 1200 mg via INTRAVENOUS

## 2019-12-15 MED ORDER — SODIUM CHLORIDE 0.9 % IV SOLN: INTRAVENOUS | Status: DC | PRN

## 2019-12-15 MED ORDER — DIPHENHYDRAMINE HCL 50 MG/ML IJ SOLN: 50.0000 mg | Freq: Once | INTRAMUSCULAR | Status: DC | PRN

## 2019-12-15 MED ORDER — EPINEPHRINE 0.3 MG/0.3ML IJ SOAJ: 0.3000 mg | Freq: Once | INTRAMUSCULAR | Status: DC | PRN

## 2019-12-15 MED ORDER — METHYLPREDNISOLONE SODIUM SUCC 125 MG IJ SOLR: 125.0000 mg | Freq: Once | INTRAMUSCULAR | Status: DC | PRN

## 2019-12-15 NOTE — Discharge Instructions (Signed)

## 2019-12-15 NOTE — Progress Notes (Signed)
  Diagnosis: COVID-19  Physician:Dr. Wright   Procedure: Covid Infusion Clinic Med: casirivimab\imdevimab infusion - Provided patient with casirivimab\imdevimab fact sheet for patients, parents and caregivers prior to infusion.  Complications: No immediate complications noted.  Discharge: Discharged home   Clelia Trabucco Ann 12/15/2019  

## 2020-01-02 ENCOUNTER — Other Ambulatory Visit: Payer: Self-pay | Admitting: Internal Medicine

## 2020-01-02 DIAGNOSIS — E039 Hypothyroidism, unspecified: Secondary | ICD-10-CM

## 2020-02-11 ENCOUNTER — Ambulatory Visit: Payer: BC Managed Care – PPO | Admitting: Family Medicine

## 2020-04-24 DIAGNOSIS — Z0189 Encounter for other specified special examinations: Secondary | ICD-10-CM | POA: Diagnosis not present

## 2020-04-26 DIAGNOSIS — E785 Hyperlipidemia, unspecified: Secondary | ICD-10-CM | POA: Diagnosis not present

## 2020-04-26 DIAGNOSIS — Z713 Dietary counseling and surveillance: Secondary | ICD-10-CM | POA: Diagnosis not present

## 2020-04-26 DIAGNOSIS — Z043 Encounter for examination and observation following other accident: Secondary | ICD-10-CM | POA: Diagnosis not present

## 2020-07-25 ENCOUNTER — Other Ambulatory Visit: Payer: Self-pay | Admitting: Internal Medicine

## 2020-07-25 DIAGNOSIS — E039 Hypothyroidism, unspecified: Secondary | ICD-10-CM

## 2020-11-01 DIAGNOSIS — J019 Acute sinusitis, unspecified: Secondary | ICD-10-CM | POA: Diagnosis not present

## 2020-11-30 DIAGNOSIS — R519 Headache, unspecified: Secondary | ICD-10-CM | POA: Diagnosis not present

## 2020-11-30 DIAGNOSIS — R110 Nausea: Secondary | ICD-10-CM | POA: Diagnosis not present

## 2020-12-07 DIAGNOSIS — J301 Allergic rhinitis due to pollen: Secondary | ICD-10-CM | POA: Diagnosis not present

## 2020-12-07 DIAGNOSIS — J329 Chronic sinusitis, unspecified: Secondary | ICD-10-CM | POA: Diagnosis not present

## 2020-12-09 DIAGNOSIS — R0789 Other chest pain: Secondary | ICD-10-CM | POA: Diagnosis not present

## 2020-12-09 DIAGNOSIS — R1011 Right upper quadrant pain: Secondary | ICD-10-CM | POA: Diagnosis not present

## 2020-12-09 DIAGNOSIS — R1013 Epigastric pain: Secondary | ICD-10-CM | POA: Diagnosis not present

## 2020-12-09 DIAGNOSIS — R079 Chest pain, unspecified: Secondary | ICD-10-CM | POA: Diagnosis not present

## 2020-12-09 DIAGNOSIS — R1012 Left upper quadrant pain: Secondary | ICD-10-CM | POA: Diagnosis not present

## 2020-12-10 DIAGNOSIS — R079 Chest pain, unspecified: Secondary | ICD-10-CM | POA: Diagnosis not present

## 2020-12-14 IMAGING — CT CT RENAL STONE PROTOCOL
3 of 4 series · 9 of 46 positions shown, 16 images · non-contrast
Comparison: None.

CLINICAL DATA: Right flank pain.

EXAM:
CT ABDOMEN AND PELVIS WITHOUT CONTRAST
TECHNIQUE: Multidetector CT imaging of the abdomen and pelvis was performed
following the standard protocol without IV contrast.

[Series 4: lung bases · axial · 0.86mm/px · z∈[-178,-98]mm · 5 of 25 slices shown, 10 images]
[im 5/25  soft-tissue]
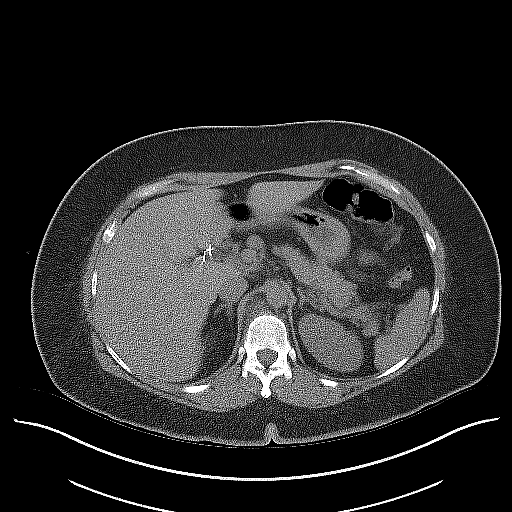
[im 5/25  bone]
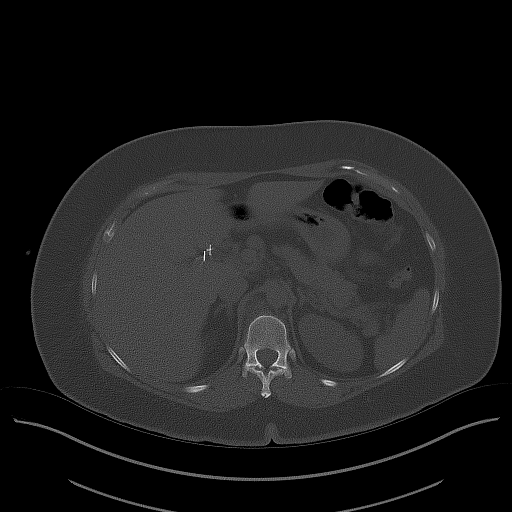
[im 9/25  soft-tissue]
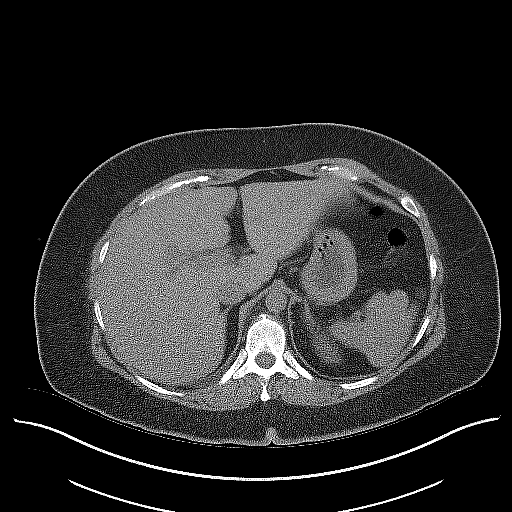
[im 9/25  lung]
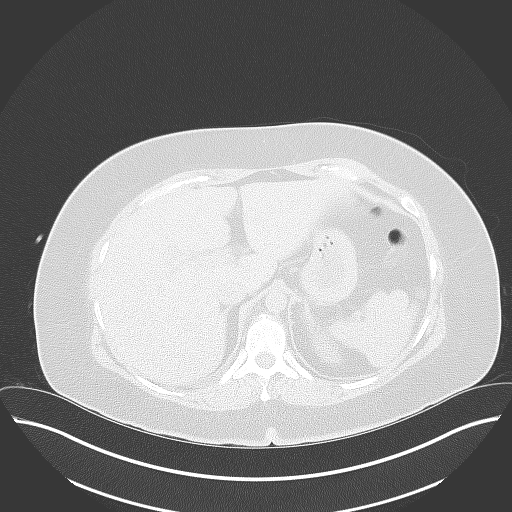
[im 13/25  soft-tissue]
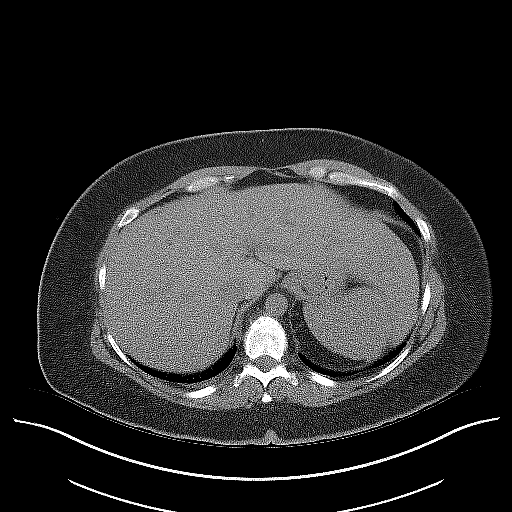
[im 13/25  lung]
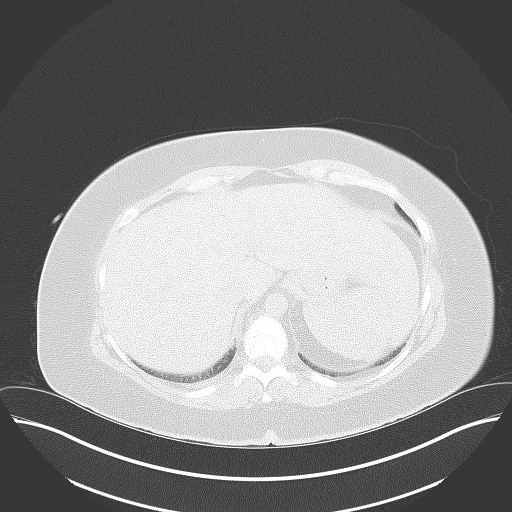
[im 17/25  soft-tissue]
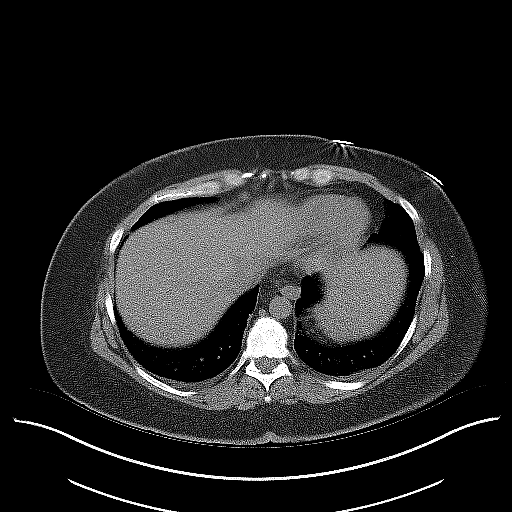
[im 17/25  lung]
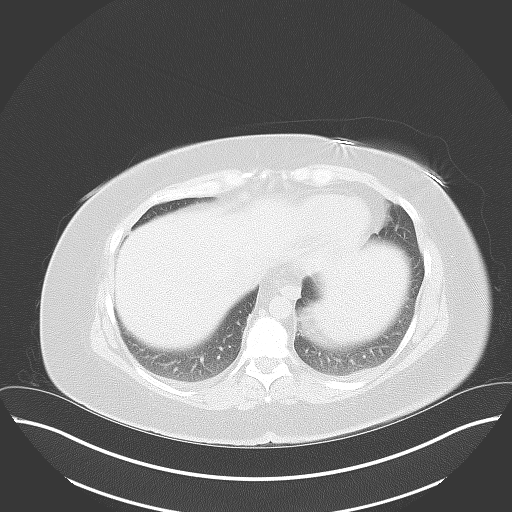
[im 21/25  soft-tissue]
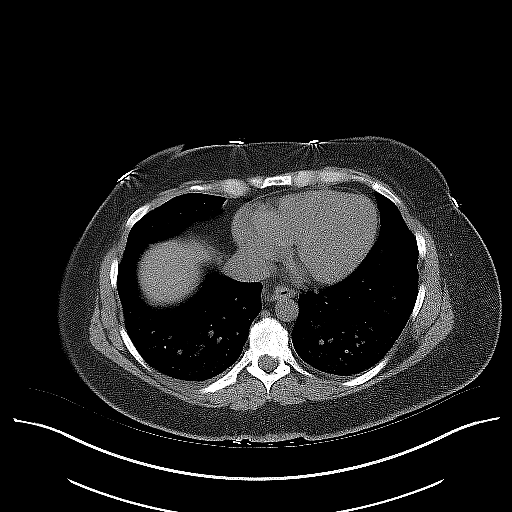
[im 21/25  lung]
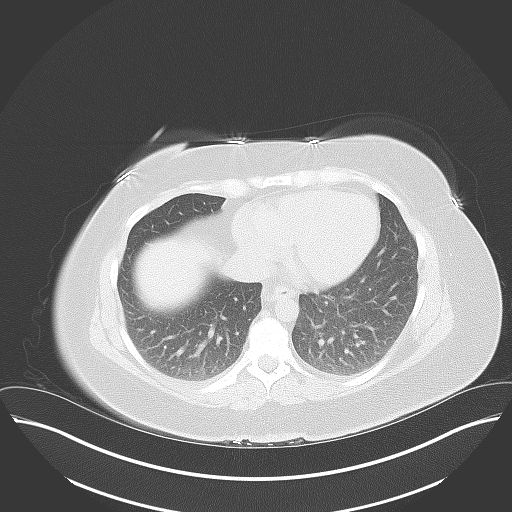

[Series 5: coronal · coronal · 0.83mm/px · 3 of 138 slices shown, 4 images]
[im 46/138  soft-tissue]
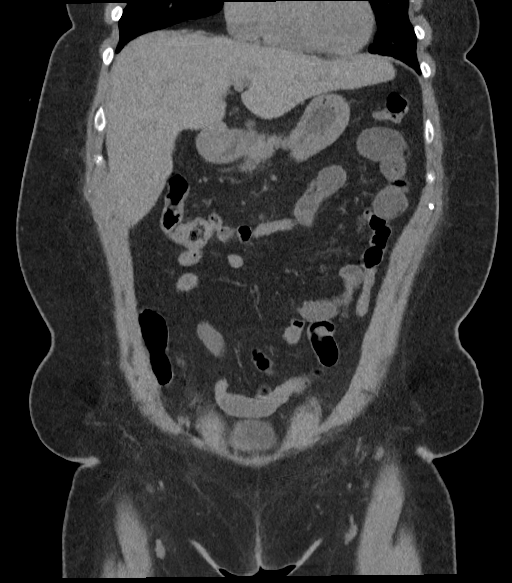
[im 61/138  soft-tissue]
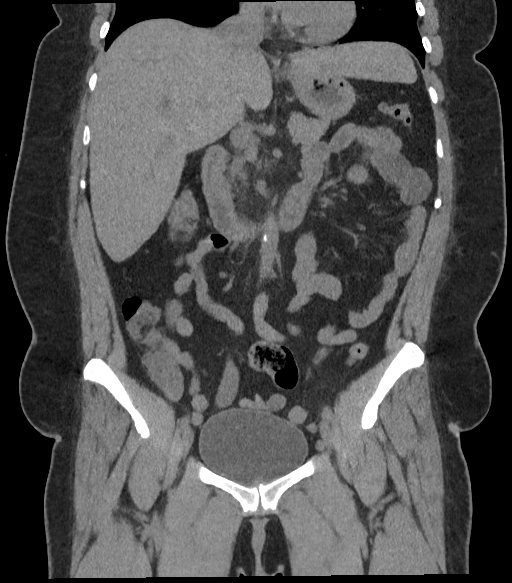
[im 61/138  bone]
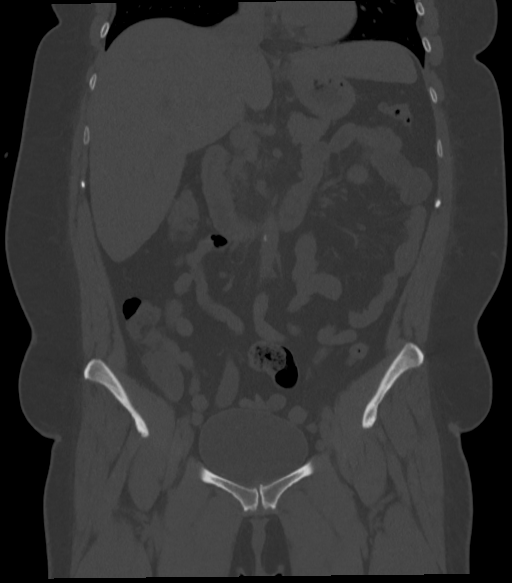
[im 77/138  soft-tissue]
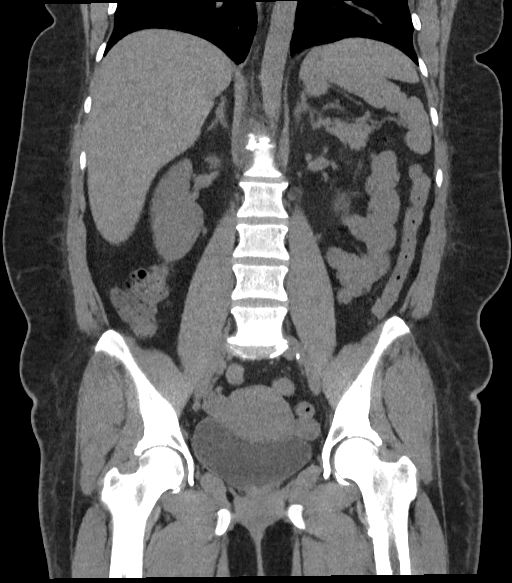

[Series 6: sagittal · sagittal · 0.58mm/px · 1 of 197 slices shown, 2 images]
[im 66/197  soft-tissue]
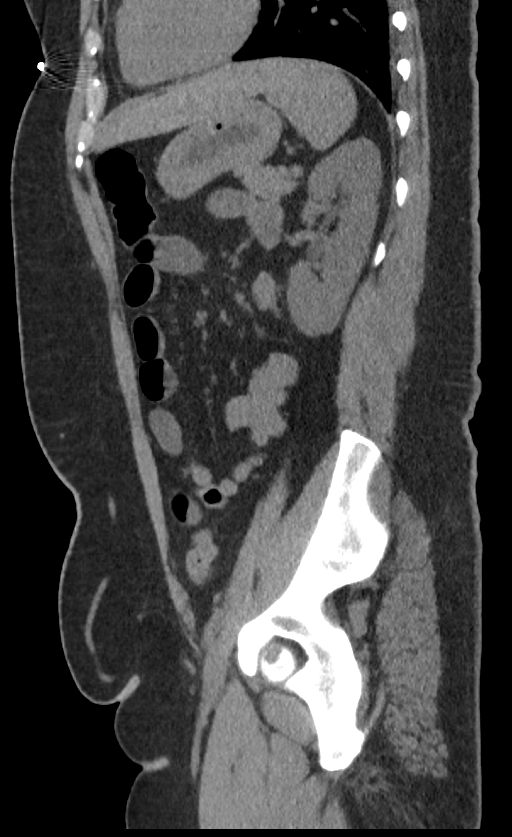
[im 66/197  bone]
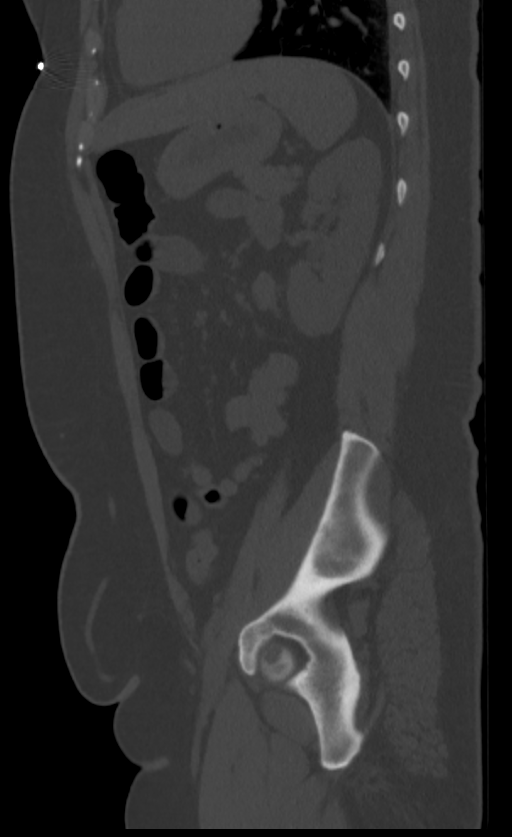

[9 of 46 positions shown; findings below may reference images not displayed]

FINDINGS: Lower chest: The lung bases are clear.

Hepatobiliary: No focal liver abnormality is seen. Status post
cholecystectomy. No biliary dilatation.

Pancreas: No ductal dilatation or inflammation.

Spleen: Normal in size without focal abnormality. Small splenule
anteriorly.

Adrenals/Urinary Tract: Normal adrenal glands. Mild fullness of the
right renal collecting system without frank hydronephrosis. No left
hydronephrosis. There is no perinephric edema. No renal or ureteral
calculi. Urinary bladder is partially distended. No bladder stone or
wall thickening.

Stomach/Bowel: Stomach is unremarkable. No small bowel obstruction
or inflammation. Terminal ileum appears normal. The appendix is
normal. Small volume of colonic stool. No colonic wall thickening or
inflammation.

Vascular/Lymphatic: Mild aortic atherosclerosis, age advanced. No
aneurysm. Scattered prominent periportal and retroperitoneal lymph
nodes are likely reactive, not enlarged by size criteria.

Reproductive: Uterus and bilateral adnexa are unremarkable. Ovaries
are symmetric.

Other: No free air, free fluid, or intra-abdominal fluid collection.

Musculoskeletal: Bilateral L5 pars interarticularis defects with
anterolisthesis of L5 on S1 and associated degenerative disc
disease. Modic endplate changes at L5-S1. Additional scattered
degenerative change in the spine. There are no acute or suspicious
osseous abnormalities.
IMPRESSION: 1. Mild fullness of the right renal collecting system without frank
hydronephrosis or perinephric edema. This may be secondary to
recently passed stone or urinary tract infection.
2. No other acute findings or explanation for pain.
3. Bilateral L5 pars interarticularis defects with anterolisthesis
of L5 on S1 and associated degenerative disc disease. Aortic
atherosclerosis is mild, but age advanced.

Aortic Atherosclerosis ([id number]).

## 2020-12-17 IMAGING — CR DG CHEST 2V
1 series · 2 of 2 positions shown · non-contrast
Comparison: 11/26/2015

CLINICAL DATA: Shortness of breath, back pain

EXAM:
CHEST - 2 VIEW

[Series 1: dg chest 2 view · 0.14mm/px · 2 of 2 slices shown]
[im 1/2]
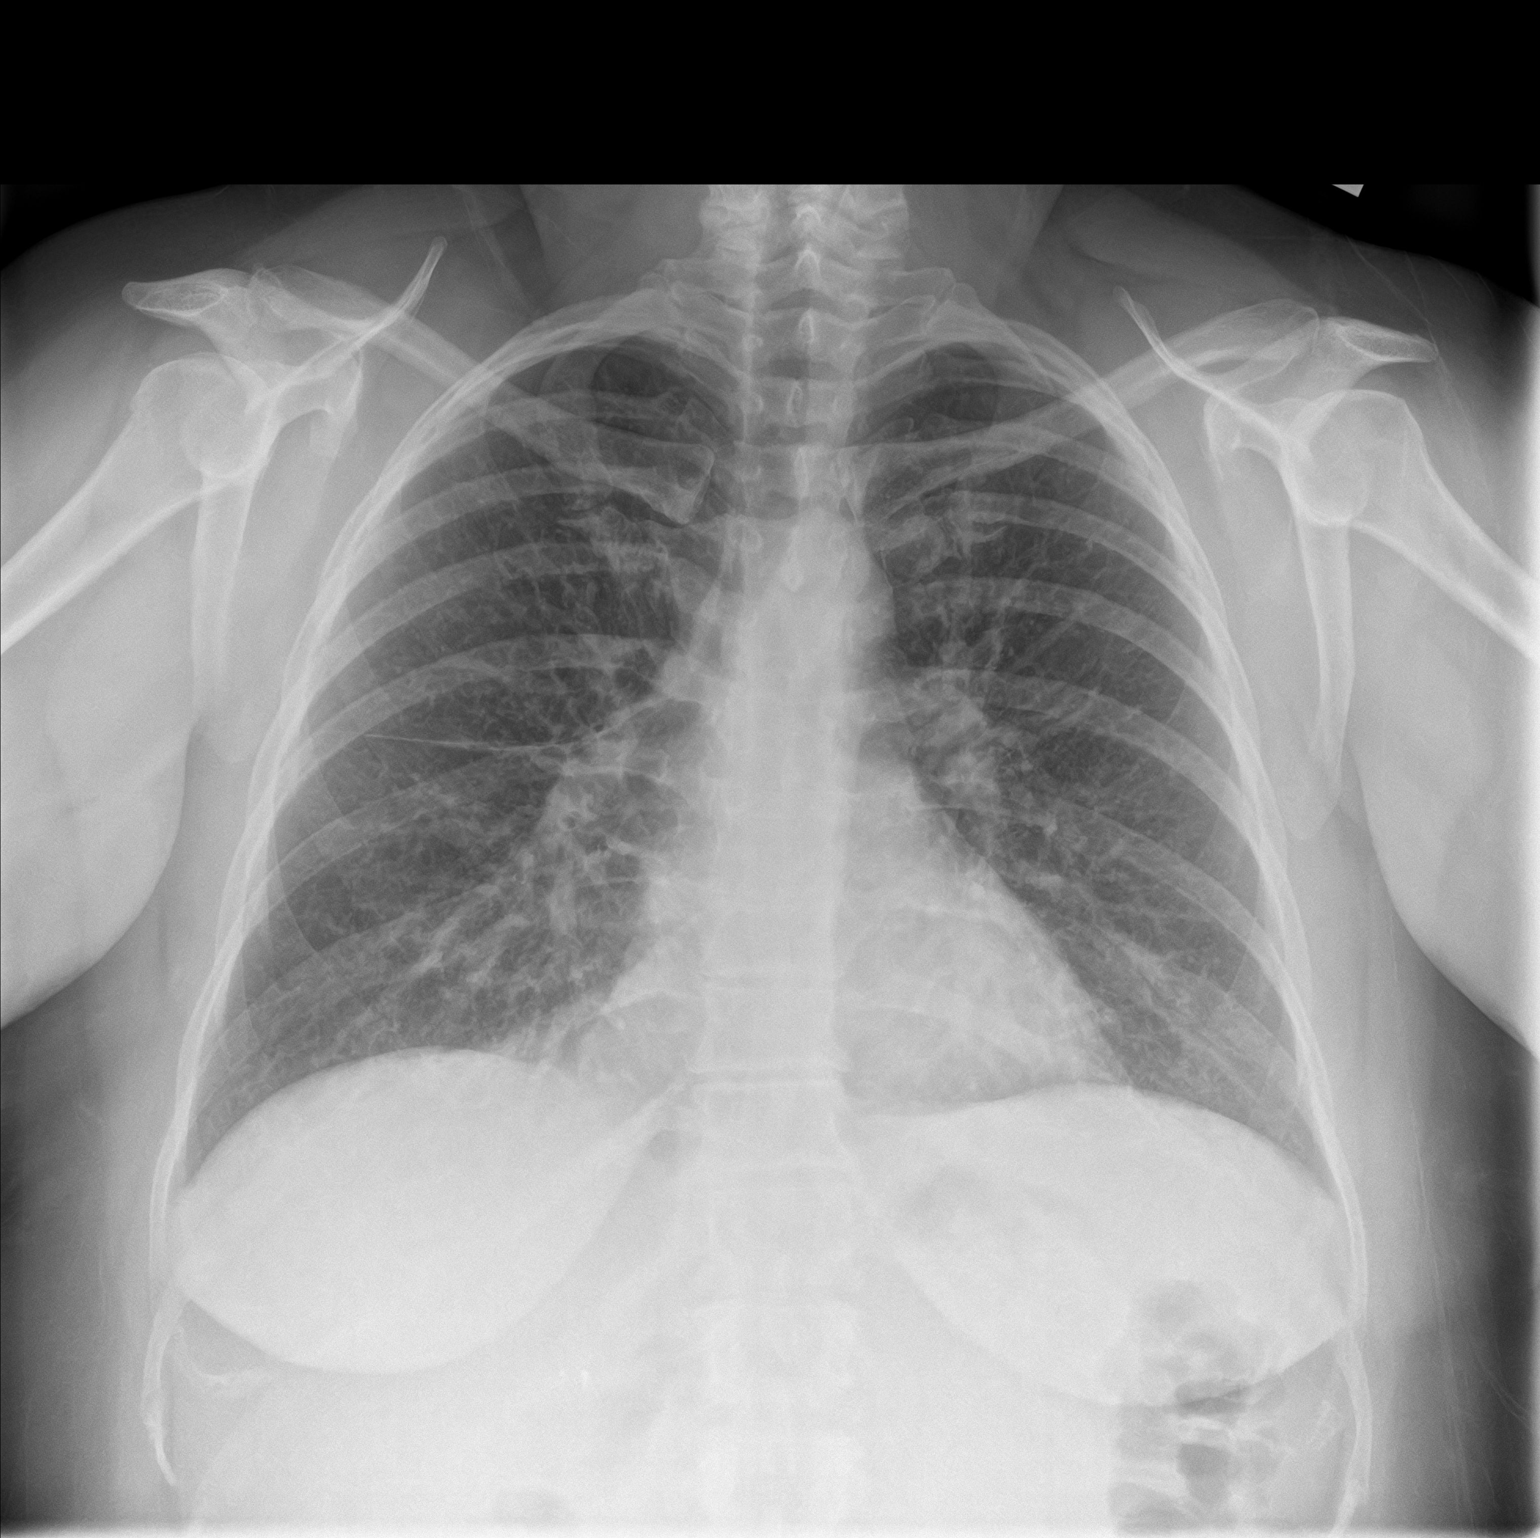
[im 2/2]
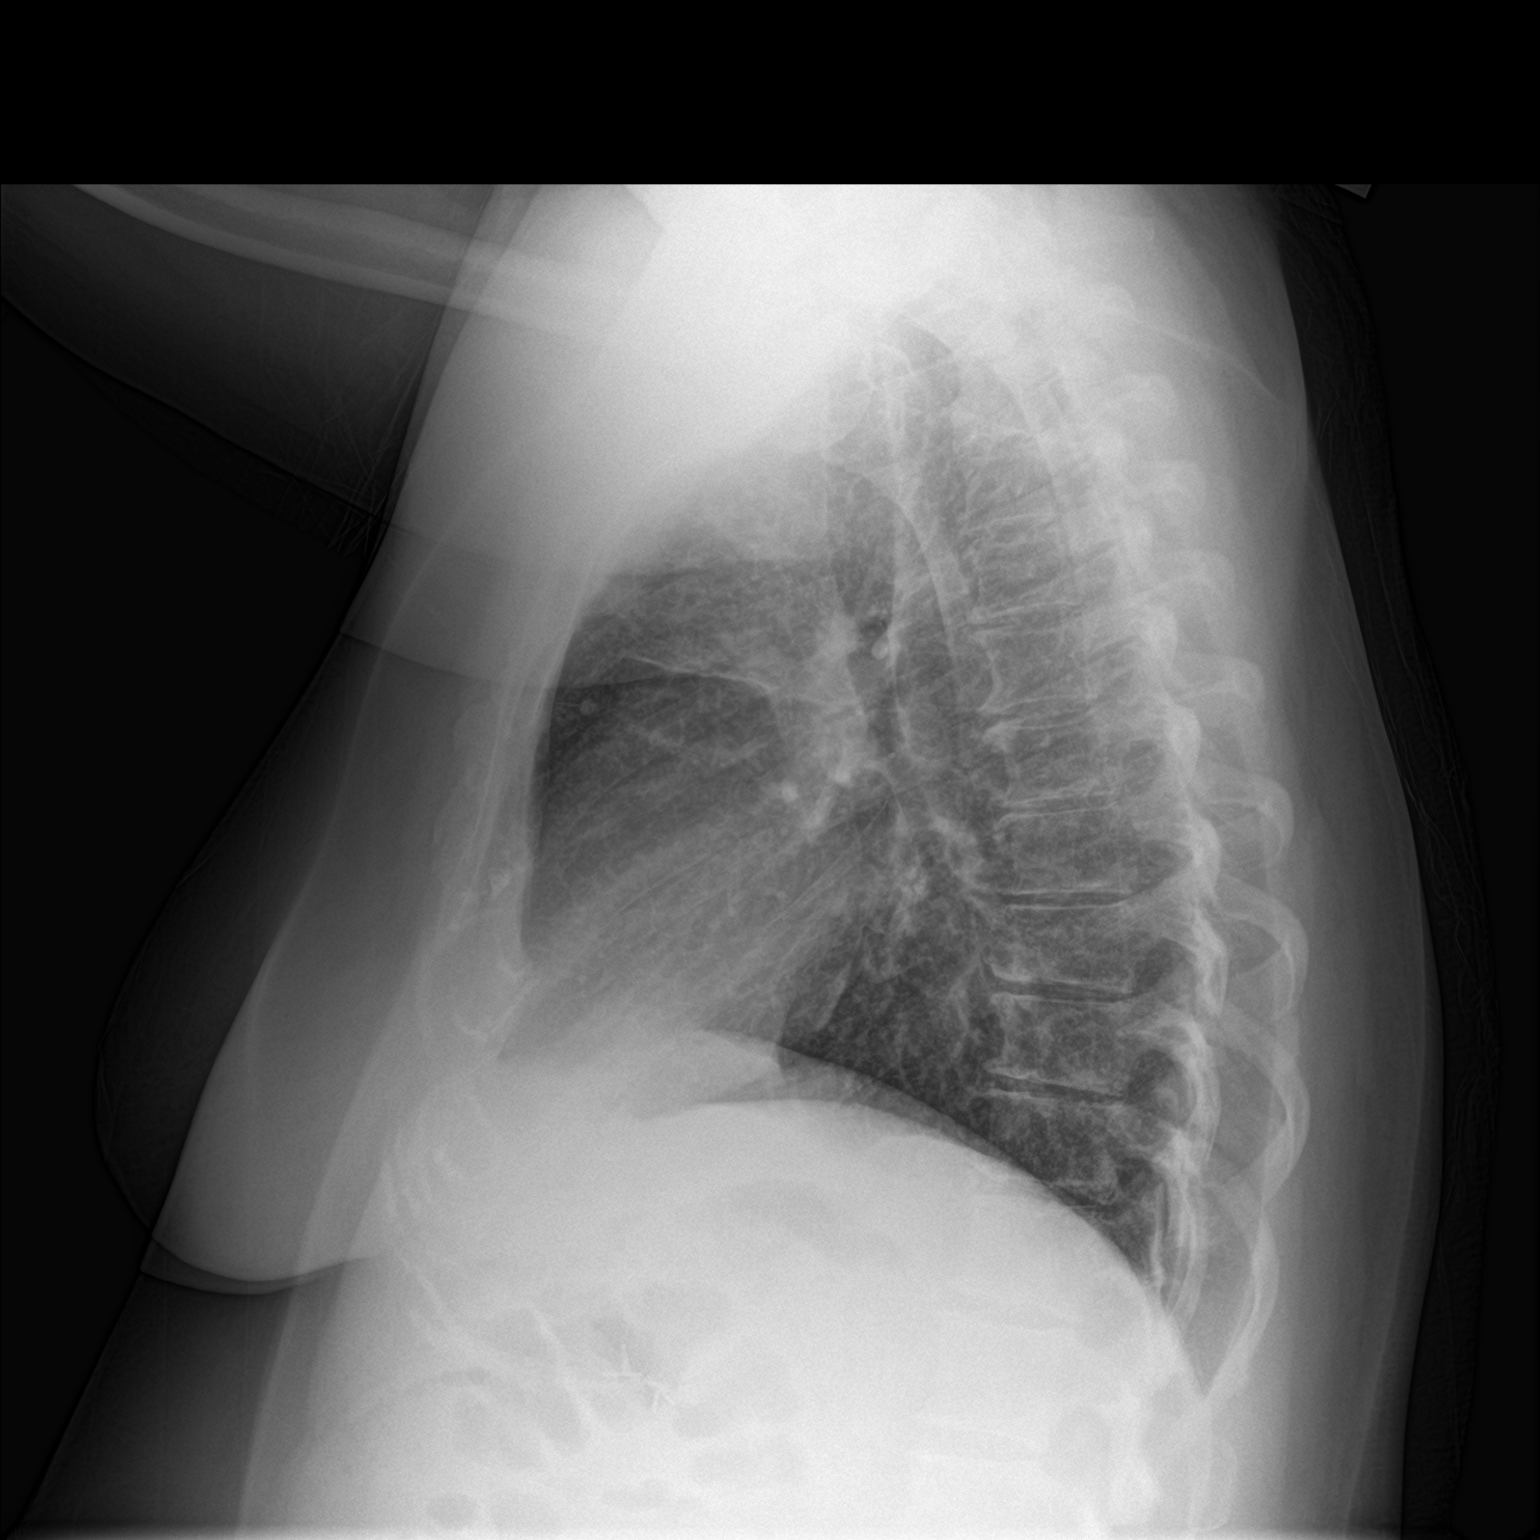

[2 of 2 positions shown; findings below may reference images not displayed]

FINDINGS: Frontal and lateral views of the chest demonstrate an unremarkable
cardiac silhouette. Chronic interstitial scarring without airspace
disease, effusion, or pneumothorax. No acute bony abnormalities.
IMPRESSION: 1. No acute intrathoracic process.

## 2020-12-29 ENCOUNTER — Other Ambulatory Visit: Payer: Self-pay | Admitting: Internal Medicine

## 2021-04-02 DIAGNOSIS — Z0189 Encounter for other specified special examinations: Secondary | ICD-10-CM | POA: Diagnosis not present

## 2021-05-07 DIAGNOSIS — J069 Acute upper respiratory infection, unspecified: Secondary | ICD-10-CM | POA: Diagnosis not present

## 2022-03-21 ENCOUNTER — Other Ambulatory Visit: Payer: Self-pay | Admitting: Internal Medicine

## 2022-06-29 LAB — GLUCOSE, POCT (MANUAL RESULT ENTRY): Glucose Fasting, POC: 243 mg/dL — AB (ref 70–99)

## 2022-11-19 ENCOUNTER — Other Ambulatory Visit: Payer: Self-pay | Admitting: Nurse Practitioner

## 2023-03-02 ENCOUNTER — Other Ambulatory Visit: Payer: Self-pay | Admitting: Nurse Practitioner
